# Patient Record
Sex: Male | Born: 1990 | Race: Black or African American | Hispanic: No | Marital: Single | Smoking: Current every day smoker
Health system: Southern US, Community
[De-identification: ages and names within clinical notes are randomized; demographics above are authoritative.]

## PROBLEM LIST (undated history)

## (undated) DIAGNOSIS — Z720 Tobacco use: Secondary | ICD-10-CM

## (undated) DIAGNOSIS — K219 Gastro-esophageal reflux disease without esophagitis: Secondary | ICD-10-CM

## (undated) HISTORY — PX: NO PAST SURGERIES: SHX2092

---
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
## 2011-01-15 LAB — URINALYSIS, ROUTINE W REFLEX MICROSCOPIC
Bilirubin Urine: NEGATIVE
Glucose, UA: NEGATIVE mg/dL
Ketones, ur: NEGATIVE mg/dL
Nitrite: NEGATIVE
Protein, ur: NEGATIVE mg/dL
Specific Gravity, Urine: 1.025 (ref 1.005–1.030)
Urobilinogen, UA: 0.2 mg/dL (ref 0.0–1.0)
pH: 6 (ref 5.0–8.0)

## 2011-01-15 LAB — URINE MICROSCOPIC-ADD ON

## 2011-01-15 LAB — URINE CULTURE
Colony Count: NO GROWTH
Culture  Setup Time: 201111051210
Culture: NO GROWTH

## 2011-01-15 LAB — GC/CHLAMYDIA PROBE AMP, URINE
Chlamydia, Swab/Urine, PCR: NEGATIVE
GC Probe Amp, Urine: POSITIVE — AB

## 2011-01-15 LAB — GRAM STAIN

## 2011-01-15 LAB — RPR: RPR Ser Ql: NONREACTIVE

## 2011-07-26 LAB — CULTURE, ROUTINE-ABSCESS: Gram Stain: NONE SEEN

## 2015-09-23 ENCOUNTER — Encounter (HOSPITAL_COMMUNITY): Payer: Self-pay | Admitting: Emergency Medicine

## 2015-09-23 ENCOUNTER — Emergency Department (HOSPITAL_COMMUNITY)
Admission: EM | Admit: 2015-09-23 | Discharge: 2015-09-23 | Disposition: A | Discharge status: Home / Self Care | Payer: Medicaid Other | Attending: Emergency Medicine | Admitting: Emergency Medicine

## 2015-09-23 DIAGNOSIS — R300 Dysuria: Secondary | ICD-10-CM | POA: Insufficient documentation

## 2015-09-23 DIAGNOSIS — F17200 Nicotine dependence, unspecified, uncomplicated: Secondary | ICD-10-CM | POA: Insufficient documentation

## 2015-09-23 DIAGNOSIS — Z711 Person with feared health complaint in whom no diagnosis is made: Secondary | ICD-10-CM

## 2015-09-23 DIAGNOSIS — R369 Urethral discharge, unspecified: Secondary | ICD-10-CM | POA: Insufficient documentation

## 2015-09-23 DIAGNOSIS — Z202 Contact with and (suspected) exposure to infections with a predominantly sexual mode of transmission: Secondary | ICD-10-CM | POA: Insufficient documentation

## 2015-09-23 LAB — URINALYSIS, ROUTINE W REFLEX MICROSCOPIC
Bilirubin Urine: NEGATIVE
GLUCOSE, UA: NEGATIVE mg/dL
KETONES UR: NEGATIVE mg/dL
LEUKOCYTES UA: NEGATIVE
Nitrite: NEGATIVE
PROTEIN: NEGATIVE mg/dL
Specific Gravity, Urine: 1.009 (ref 1.005–1.030)
pH: 6 (ref 5.0–8.0)

## 2015-09-23 LAB — URINE MICROSCOPIC-ADD ON: Bacteria, UA: NONE SEEN

## 2015-09-23 LAB — RPR: RPR Ser Ql: NONREACTIVE

## 2015-09-23 LAB — HIV ANTIBODY (ROUTINE TESTING W REFLEX): HIV Screen 4th Generation wRfx: NONREACTIVE

## 2015-09-23 MED ORDER — CEFTRIAXONE SODIUM 250 MG IJ SOLR
250.0000 mg | Freq: Once | INTRAMUSCULAR | Status: AC
  Administered 2015-09-23: 250 mg via INTRAMUSCULAR
  Filled 2015-09-23: qty 250

## 2015-09-23 MED ORDER — IBUPROFEN 200 MG PO TABS
600.0000 mg | ORAL_TABLET | Freq: Once | ORAL | Status: AC
  Administered 2015-09-23: 600 mg via ORAL
  Filled 2015-09-23: qty 3

## 2015-09-23 MED ORDER — AZITHROMYCIN 250 MG PO TABS
1000.0000 mg | ORAL_TABLET | Freq: Once | ORAL | Status: AC
  Administered 2015-09-23: 1000 mg via ORAL
  Filled 2015-09-23: qty 4

## 2015-09-23 MED ORDER — LIDOCAINE HCL (PF) 1 % IJ SOLN
INTRAMUSCULAR | Status: AC
  Administered 2015-09-23: 1 mL
  Filled 2015-09-23: qty 5

## 2015-09-23 NOTE — ED Notes (Signed)
Patient reports that he believes he has a STD. Reports lower back pain, clear penile discharge, and pain/burning with urination. Reports back pain as 6/10 on 0-10 pain scale.

## 2015-09-23 NOTE — Discharge Instructions (Signed)
You were seen today with concerns for an STD. You need to abstain from sexual activity for the next 10 days. You also need to use protection.  Sexually Transmitted Disease A sexually transmitted disease (STD) is a disease or infection that may be passed (transmitted) from person to person, usually during sexual activity. This may happen by way of saliva, semen, blood, vaginal mucus, or urine. Common STDs include:  Gonorrhea.  Chlamydia.  Syphilis.  HIV and AIDS.  Genital herpes.  Hepatitis B and C.  Trichomonas.  Human papillomavirus (HPV).  Pubic lice.  Scabies.  Mites.  Bacterial vaginosis. WHAT ARE CAUSES OF STDs? An STD may be caused by bacteria, a virus, or parasites. STDs are often transmitted during sexual activity if one person is infected. However, they may also be transmitted through nonsexual means. STDs may be transmitted after:   Sexual intercourse with an infected person.  Sharing sex toys with an infected person.  Sharing needles with an infected person or using unclean piercing or tattoo needles.  Having intimate contact with the genitals, mouth, or rectal areas of an infected person.  Exposure to infected fluids during birth. WHAT ARE THE SIGNS AND SYMPTOMS OF STDs? Different STDs have different symptoms. Some people may not have any symptoms. If symptoms are present, they may include:  Painful or bloody urination.  Pain in the pelvis, abdomen, vagina, anus, throat, or eyes.  A skin rash, itching, or irritation.  Growths, ulcerations, blisters, or sores in the genital and anal areas.  Abnormal vaginal discharge with or without bad odor.  Penile discharge in men.  Fever.  Pain or bleeding during sexual intercourse.  Swollen glands in the groin area.  Yellow skin and eyes (jaundice). This is seen with hepatitis.  Swollen testicles.  Infertility.  Sores and blisters in the mouth. HOW ARE STDs DIAGNOSED? To make a diagnosis, your  health care provider may:  Take a medical history.  Perform a physical exam.  Take a sample of any discharge to examine.  Swab the throat, cervix, opening to the penis, rectum, or vagina for testing.  Test a sample of your first morning urine.  Perform blood tests.  Perform a Pap test, if this applies.  Perform a colposcopy.  Perform a laparoscopy. HOW ARE STDs TREATED? Treatment depends on the STD. Some STDs may be treated but not cured.  Chlamydia, gonorrhea, trichomonas, and syphilis can be cured with antibiotic medicine.  Genital herpes, hepatitis, and HIV can be treated, but not cured, with prescribed medicines. The medicines lessen symptoms.  Genital warts from HPV can be treated with medicine or by freezing, burning (electrocautery), or surgery. Warts may come back.  HPV cannot be cured with medicine or surgery. However, abnormal areas may be removed from the cervix, vagina, or vulva.  If your diagnosis is confirmed, your recent sexual partners need treatment. This is true even if they are symptom-free or have a negative culture or evaluation. They should not have sex until their health care providers say it is okay.  Your health care provider may test you for infection again 3 months after treatment. HOW CAN I REDUCE MY RISK OF GETTING AN STD? Take these steps to reduce your risk of getting an STD:  Use latex condoms, dental dams, and water-soluble lubricants during sexual activity. Do not use petroleum jelly or oils.  Avoid having multiple sex partners.  Do not have sex with someone who has other sex partners  Do not have sex with anyone  you do not know or who is at high risk for an STD.  Avoid risky sex practices that can break your skin.  Do not have sex if you have open sores on your mouth or skin.  Avoid drinking too much alcohol or taking illegal drugs. Alcohol and drugs can affect your judgment and put you in a vulnerable position.  Avoid engaging in  oral and anal sex acts.  Get vaccinated for HPV and hepatitis. If you have not received these vaccines in the past, talk to your health care provider about whether one or both might be right for you.  If you are at risk of being infected with HIV, it is recommended that you take a prescription medicine daily to prevent HIV infection. This is called pre-exposure prophylaxis (PrEP). You are considered at risk if:  You are a man who has sex with other men (MSM).  You are a heterosexual man or woman and are sexually active with more than one partner.  You take drugs by injection.  You are sexually active with a partner who has HIV.  Talk with your health care provider about whether you are at high risk of being infected with HIV. If you choose to begin PrEP, you should first be tested for HIV. You should then be tested every 3 months for as long as you are taking PrEP. WHAT SHOULD I DO IF I THINK I HAVE AN STD?  See your health care provider.  Tell your sexual partner(s). They should be tested and treated for any STDs.  Do not have sex until your health care provider says it is okay. WHEN SHOULD I GET IMMEDIATE MEDICAL CARE? Contact your health care provider right away if:   You have severe abdominal pain.  You are a man and notice swelling or pain in your testicles.  You are a woman and notice swelling or pain in your vagina.   This information is not intended to replace advice given to you by your health care provider. Make sure you discuss any questions you have with your health care provider.   Document Released: 01/11/2003 Document Revised: 11/11/2014 Document Reviewed: 05/11/2013 Elsevier Interactive Patient Education [employer] Inc[employer]

## 2015-09-23 NOTE — ED Provider Notes (Signed)
CSN: [id number]     Arrival date & time 09/23/15  [id number] History   First MD Initiated Contact with Patient 09/23/15 0654     Chief Complaint  Patient presents with  . SEXUALLY TRANSMITTED DISEASE     (Consider location/radiation/quality/duration/timing/severity/associated sxs/prior Treatment) HPI  This a 24 year old male who presents with concerns for STDs. Patient reports recent history of dysuria, clear penile discharge, lower back pain. History of chlamydia with the same symptoms. Back pain is across his lower back and currently 6 out of 10. He has not taken any medications for. Denies any fevers. Does report multiple sexual partners. Does not always use condoms.  History reviewed. No pertinent past medical history. History reviewed. No pertinent past surgical history. No family history on file. Social History  Substance Use Topics  . Smoking status: Current Every Day Smoker  . Smokeless tobacco: None  . Alcohol Use: None    Review of Systems  Constitutional: Negative for fever.  Genitourinary: Positive for dysuria and discharge.  Musculoskeletal: Negative for back pain.  All other systems reviewed and are negative.     Allergies  Review of patient's allergies indicates no known allergies.  Home Medications   Prior to Admission medications   Not on File   BP 116/92 mmHg  Pulse 87  Temp(Src) 99.5 F (37.5 C) (Oral)  Resp 18  Ht 5\' 6"  (1.676 m)  SpO2 96% Physical Exam  Constitutional: He is oriented to person, place, and time. He appears well-developed and well-nourished. No distress.  HENT:  Head: Normocephalic and atraumatic.  Cardiovascular: Normal rate and regular rhythm.   Pulmonary/Chest: Effort normal. No respiratory distress.  Abdominal: Soft. There is no tenderness.  Musculoskeletal: He exhibits no edema.  No tenderness to palpation over the lower lumbar spine paraspinous muscle region  Neurological: He is alert and oriented to person, place, and  time.  Skin: Skin is warm and dry.  Psychiatric: He has a normal mood and affect.  Nursing note and vitals reviewed.   ED Course  Procedures (including critical care time) Labs Review Labs Reviewed  URINALYSIS, ROUTINE W REFLEX MICROSCOPIC (NOT AT [facility])  RPR  HIV ANTIBODY (ROUTINE TESTING)  GC/CHLAMYDIA PROBE AMP ([place]) NOT [facility]    Imaging Review No results found. I have personally reviewed and evaluated these images and lab results as part of my medical decision-making.   EKG Interpretation None      MDM   Final diagnoses:  Concern about STD in male without diagnosis    Patient presents with concerns for STDs. History of the same similar symptoms. Nontoxic on exam. Afebrile. No CVA or lumbar tenderness. Patient was given ibuprofen. He was empirically treated for gonorrhea and chlamydia with azithromycin and Rocephin. STD panel sent including GC and chlamydia. Urinalysis largely unremarkable.  Patient instructed to abstain from sexual activity for the next 10 days. He was educated on safe sex practices.  After history, exam, and medical workup I feel the patient has been appropriately medically screened and is safe for discharge home. Pertinent diagnoses were discussed with the patient. Patient was given return precautions.     Shon Batonourtney F Leanndra Pember, MD 09/23/15 2300

## 2015-09-27 LAB — GC/CHLAMYDIA PROBE AMP (~~LOC~~) NOT AT ARMC
Chlamydia: NEGATIVE
Neisseria Gonorrhea: NEGATIVE

## 2016-10-26 ENCOUNTER — Encounter (HOSPITAL_COMMUNITY): Payer: Self-pay | Admitting: Emergency Medicine

## 2016-10-26 ENCOUNTER — Emergency Department (HOSPITAL_COMMUNITY)
Admission: EM | Admit: 2016-10-26 | Discharge: 2016-10-26 | Disposition: A | Discharge status: Home / Self Care | Payer: Medicaid Other | Attending: Emergency Medicine | Admitting: Emergency Medicine

## 2016-10-26 DIAGNOSIS — R1012 Left upper quadrant pain: Secondary | ICD-10-CM

## 2016-10-26 DIAGNOSIS — F17200 Nicotine dependence, unspecified, uncomplicated: Secondary | ICD-10-CM | POA: Insufficient documentation

## 2016-10-26 DIAGNOSIS — E876 Hypokalemia: Secondary | ICD-10-CM | POA: Insufficient documentation

## 2016-10-26 LAB — COMPREHENSIVE METABOLIC PANEL
ALBUMIN: 4.2 g/dL (ref 3.5–5.0)
ALT: 8 U/L — AB (ref 17–63)
ANION GAP: 12 (ref 5–15)
AST: 19 U/L (ref 15–41)
Alkaline Phosphatase: 49 U/L (ref 38–126)
BUN: 12 mg/dL (ref 6–20)
CHLORIDE: 103 mmol/L (ref 101–111)
CO2: 23 mmol/L (ref 22–32)
Calcium: 9.4 mg/dL (ref 8.9–10.3)
Creatinine, Ser: 1 mg/dL (ref 0.61–1.24)
GFR calc non Af Amer: >60 mL/min (ref >60)
Glucose, Bld: 100 mg/dL — ABNORMAL HIGH (ref 65–99)
Potassium: 3.4 mmol/L — ABNORMAL LOW (ref 3.5–5.1)
SODIUM: 138 mmol/L (ref 135–145)
Total Bilirubin: 0.4 mg/dL (ref 0.3–1.2)
Total Protein: 7 g/dL (ref 6.5–8.1)

## 2016-10-26 LAB — CBC
HCT: 43.1 % (ref 39.0–52.0)
HEMOGLOBIN: 14.8 g/dL (ref 13.0–17.0)
MCH: 29.1 pg (ref 26.0–34.0)
MCHC: 34.3 g/dL (ref 30.0–36.0)
MCV: 84.7 fL (ref 78.0–100.0)
Platelets: 229 10*3/uL (ref 150–400)
RBC: 5.09 MIL/uL (ref 4.22–5.81)
RDW: 14.1 % (ref 11.5–15.5)
WBC: 7 10*3/uL (ref 4.0–10.5)

## 2016-10-26 LAB — URINALYSIS, ROUTINE W REFLEX MICROSCOPIC
BILIRUBIN URINE: NEGATIVE
Glucose, UA: NEGATIVE mg/dL
Hgb urine dipstick: NEGATIVE
KETONES UR: NEGATIVE mg/dL
Leukocytes, UA: NEGATIVE
Nitrite: NEGATIVE
PROTEIN: NEGATIVE mg/dL
SPECIFIC GRAVITY, URINE: 1.029 (ref 1.005–1.030)
pH: 5 (ref 5.0–8.0)

## 2016-10-26 LAB — I-STAT CG4 LACTIC ACID, ED: LACTIC ACID, VENOUS: 0.85 mmol/L (ref 0.5–1.9)

## 2016-10-26 LAB — LIPASE, BLOOD: LIPASE: 25 U/L (ref 11–51)

## 2016-10-26 MED ORDER — OMEPRAZOLE 20 MG PO CPDR: 20.0000 mg | DELAYED_RELEASE_CAPSULE | Freq: Every day | ORAL | 0 refills | Status: DC

## 2016-10-26 MED ORDER — PANTOPRAZOLE SODIUM 40 MG PO TBEC
40.0000 mg | DELAYED_RELEASE_TABLET | Freq: Once | ORAL | Status: AC
  Administered 2016-10-26: 40 mg via ORAL
  Filled 2016-10-26: qty 1

## 2016-10-26 MED ORDER — POTASSIUM CHLORIDE CRYS ER 20 MEQ PO TBCR
40.0000 meq | EXTENDED_RELEASE_TABLET | Freq: Once | ORAL | Status: AC
  Administered 2016-10-26: 40 meq via ORAL
  Filled 2016-10-26: qty 2

## 2016-10-26 NOTE — Discharge Instructions (Signed)
1. Medications: omeprazole, usual home medications 2. Treatment: rest, drink plenty of fluids,  3. Follow Up: Please followup with your primary doctor in 2 days for discussion of your diagnoses and further evaluation after today's visit; if you do not have a primary care doctor use the resource guide provided to find one; Please return to the ER for persistent vomiting, high fevers or worsening symptoms

## 2016-10-26 NOTE — ED Triage Notes (Signed)
Pt states "Ive had some stomach pain for two days." Pt pointing to LUQ. nontender to palpation. Denies n/v/d. Pt in NAD. Pain 5/10.

## 2016-10-26 NOTE — ED Provider Notes (Signed)
[facility]-EMERGENCY DEPT Provider Note   CSN: [id number] Arrival date & time: 10/26/16  1759     History   Chief Complaint Chief Complaint  Patient presents with  . Abdominal Pain    HPI Reginald Farmer is a 25 y.o. male with No major medical problems presents to the Emergency Department complaining of intermittent left upper quadrant abdominal pain onset 2 weeks ago with an intense episode 2 days ago. He states pain is cramping and rated at a 5/10 when it happens. No radiation of the pain. It lasts less than 30 minutes and resolves. No pain today.  Patient denies pain currently. Patient denies fever, chills, nausea, vomiting, diarrhea, diaphoresis, chest pain, shortness of breath, dysuria, hematuria, penile discharge. Patient reports he drinks alcohol socially. Pain is not changed with food or drink. He smokes marijuana regularly. No IV drug use. No history of abdominal surgeries. Pt reports he is here today to "just get checked out."    The history is provided by the patient and medical records. No language interpreter was used.    History reviewed. No pertinent past medical history.  There are no active problems to display for this patient.   History reviewed. No pertinent surgical history.     Home Medications    Prior to Admission medications   Medication Sig Start Date End Date Taking? Authorizing Provider  omeprazole (PRILOSEC) 20 MG capsule Take 1 capsule (20 mg total) by mouth daily. 10/26/16   Dahlia ClientHannah Fareeha Evon, PA-C    Family History No family history on file.  Social History Social History  Substance Use Topics  . Smoking status: Current Every Day Smoker  . Smokeless tobacco: Not on file  . Alcohol use No     Allergies   Patient has no known allergies.   Review of Systems Review of Systems  Gastrointestinal: Positive for abdominal pain ( LUQ, resolved).  All other systems reviewed and are negative.    Physical Exam Updated Vital Signs BP  135/90 (BP Location: Right Arm)   Pulse 100   Temp 99.2 F (37.3 C) (Oral)   Resp 25   Ht 5\' 6"  (1.676 m)   Wt 99.2 kg   SpO2 96%   BMI 35.28 kg/m   Physical Exam  Constitutional: He appears well-developed and well-nourished. No distress.  Awake, alert, nontoxic appearance  HENT:  Head: Normocephalic and atraumatic.  Mouth/Throat: Oropharynx is clear and moist. No oropharyngeal exudate.  Eyes: Conjunctivae are normal. No scleral icterus.  Neck: Normal range of motion. Neck supple.  Cardiovascular: Normal rate, regular rhythm and intact distal pulses.   Pulmonary/Chest: Effort normal and breath sounds normal. No respiratory distress. He has no wheezes.  Equal chest expansion  Abdominal: Soft. Bowel sounds are normal. He exhibits no mass. There is no tenderness. There is no rebound and no guarding.  Musculoskeletal: Normal range of motion. He exhibits no edema.  Neurological: He is alert.  Speech is clear and goal oriented Moves extremities without ataxia  Skin: Skin is warm and dry. He is not diaphoretic.  Psychiatric: He has a normal mood and affect.  Nursing note and vitals reviewed.    ED Treatments / Results  Labs (all labs ordered are listed, but only abnormal results are displayed) Labs Reviewed  COMPREHENSIVE METABOLIC PANEL - Abnormal; Notable for the following:       Result Value   Potassium 3.4 (*)    Glucose, Bld 100 (*)    ALT 8 (*)  All other components within normal limits  LIPASE, BLOOD  CBC  URINALYSIS, ROUTINE W REFLEX MICROSCOPIC  I-STAT CG4 LACTIC ACID, ED    Procedures Procedures (including critical care time)  Medications Ordered in ED Medications  pantoprazole (PROTONIX) EC tablet 40 mg (not administered)  potassium chloride SA (K-DUR,KLOR-CON) CR tablet 40 mEq (not administered)     Initial Impression / Assessment and Plan / ED Course  I have reviewed the triage vital signs and the nursing notes.  Pertinent labs & imaging results  that were available during my care of the patient were reviewed by me and considered in my medical decision making (see chart for details).  Clinical Course as of Oct 26 2040  Sat Oct 26, 2016  2036 Triage temperature 99.2. Recommend recheck rectally and patient refuses.  [HM]  2037 No UTI Leukocytes, UA: NEGATIVE [HM]  2037 Mild, repleated Potassium: (!) 3.4 [HM]  2037 normal Lactic Acid, Venous: 0.85 [HM]  2037 No leukocytosis WBC: 7.0 [HM]  2037 Normal Lipase: 25 [HM]  2038 Tachycardia at triage, no tachycardia on clinical exam.  No hx of DVT, immobilization, CP, SOB or hypoxia.   Pulse Rate: 100 [HM]    Clinical Course User Index [HM] Dahlia ClientHannah Dariush Mcnellis, PA-C    Patient is nontoxic, nonseptic appearing, in no apparent distress.  Patient without pain in the ER.  Labs and vitals reviewed.  Patient does not meet the SIRS or Sepsis criteria.  On exam patient does not have a surgical abdomin and there are no peritoneal signs.  No indication of appendicitis, bowel obstruction, bowel perforation, cholecystitis, diverticulitis.  Patient discharged home with short course of omeprazole and given strict instructions for follow-up with their primary care physician.  I have also discussed reasons to return immediately to the ER.  Patient expresses understanding and agrees with plan.    Final Clinical Impressions(s) / ED Diagnoses   Final diagnoses:  Left upper quadrant pain  Hypokalemia    New Prescriptions New Prescriptions   OMEPRAZOLE (PRILOSEC) 20 MG CAPSULE    Take 1 capsule (20 mg total) by mouth daily.     Dahlia ClientHannah Camiyah Friberg, PA-C 10/26/16 [id number]    Pricilla LovelessScott Goldston, MD 10/28/16 [phone]

## 2018-05-14 ENCOUNTER — Emergency Department (HOSPITAL_COMMUNITY)
Admission: EM | Admit: 2018-05-14 | Discharge: 2018-05-14 | Disposition: A | Discharge status: Home / Self Care | Payer: Self-pay | Attending: Emergency Medicine | Admitting: Emergency Medicine

## 2018-05-14 ENCOUNTER — Other Ambulatory Visit: Payer: Self-pay

## 2018-05-14 ENCOUNTER — Encounter (HOSPITAL_COMMUNITY): Payer: Self-pay | Admitting: Emergency Medicine

## 2018-05-14 DIAGNOSIS — Z79899 Other long term (current) drug therapy: Secondary | ICD-10-CM | POA: Insufficient documentation

## 2018-05-14 DIAGNOSIS — R51 Headache: Secondary | ICD-10-CM | POA: Insufficient documentation

## 2018-05-14 DIAGNOSIS — F17200 Nicotine dependence, unspecified, uncomplicated: Secondary | ICD-10-CM | POA: Insufficient documentation

## 2018-05-14 DIAGNOSIS — R519 Headache, unspecified: Secondary | ICD-10-CM

## 2018-05-14 DIAGNOSIS — R1084 Generalized abdominal pain: Secondary | ICD-10-CM | POA: Insufficient documentation

## 2018-05-14 LAB — URINALYSIS, ROUTINE W REFLEX MICROSCOPIC
Bilirubin Urine: NEGATIVE
GLUCOSE, UA: NEGATIVE mg/dL
Hgb urine dipstick: NEGATIVE
Ketones, ur: NEGATIVE mg/dL
LEUKOCYTES UA: NEGATIVE
NITRITE: NEGATIVE
PH: 5 (ref 5.0–8.0)
Protein, ur: NEGATIVE mg/dL
SPECIFIC GRAVITY, URINE: 1.03 (ref 1.005–1.030)

## 2018-05-14 LAB — CBC
HEMATOCRIT: 45.4 % (ref 39.0–52.0)
HEMOGLOBIN: 14.4 g/dL (ref 13.0–17.0)
MCH: 27.2 pg (ref 26.0–34.0)
MCHC: 31.7 g/dL (ref 30.0–36.0)
MCV: 85.7 fL (ref 78.0–100.0)
Platelets: 253 10*3/uL (ref 150–400)
RBC: 5.3 MIL/uL (ref 4.22–5.81)
RDW: 14.6 % (ref 11.5–15.5)
WBC: 10.3 10*3/uL (ref 4.0–10.5)

## 2018-05-14 LAB — LIPASE, BLOOD: LIPASE: 35 U/L (ref 11–51)

## 2018-05-14 LAB — COMPREHENSIVE METABOLIC PANEL
ALT: 9 U/L (ref 0–44)
ANION GAP: 13 (ref 5–15)
AST: 16 U/L (ref 15–41)
Albumin: 4.3 g/dL (ref 3.5–5.0)
Alkaline Phosphatase: 51 U/L (ref 38–126)
BUN: 12 mg/dL (ref 6–20)
CHLORIDE: 103 mmol/L (ref 98–111)
CO2: 25 mmol/L (ref 22–32)
Calcium: 9.6 mg/dL (ref 8.9–10.3)
Creatinine, Ser: 1.29 mg/dL — ABNORMAL HIGH (ref 0.61–1.24)
GFR calc Af Amer: >60 mL/min (ref >60)
Glucose, Bld: 94 mg/dL (ref 70–99)
Potassium: 3.7 mmol/L (ref 3.5–5.1)
Sodium: 141 mmol/L (ref 135–145)
TOTAL PROTEIN: 7.3 g/dL (ref 6.5–8.1)
Total Bilirubin: 0.9 mg/dL (ref 0.3–1.2)

## 2018-05-14 MED ORDER — IBUPROFEN 400 MG PO TABS
600.0000 mg | ORAL_TABLET | Freq: Once | ORAL | Status: AC
  Administered 2018-05-14: 600 mg via ORAL
  Filled 2018-05-14: qty 1

## 2018-05-14 NOTE — ED Triage Notes (Signed)
Pt reports headache and abdominal pain since two am this morning.  Pt reports he took tylenol however the pain continues.

## 2018-05-14 NOTE — ED Provider Notes (Signed)
[facility] Provider Note   CSN: [id number] Arrival date & time: 05/14/18  0536     History   Chief Complaint Chief Complaint  Patient presents with  . Headache  . Abdominal Pain    HPI Reginald Farmer is a 27 y.o. male.  27 year old male without significant prior medical history presents for evaluation of reported headache and abdominal pain.  Patient reports that he had a mild headache that started around 10 PM last night.  He developed mild upper abdominal discomfort sometime around 2 AM.  He reports taking a dose of Tylenol sometime around 3 AM for his symptoms.  He now reports that his symptoms are improved.  He denies associated fever, nausea, vomiting, chest pain, shortness of breath, or other acute complaint.  Patient declines IV access for IVF/IV medications or even IM medications.  He desires a work note for his illness.  The history is provided by the patient and medical records.  Illness  This is a new problem. The current episode started 6 to 12 hours ago. The problem occurs rarely. The problem has been resolved. Associated symptoms include abdominal pain. Pertinent negatives include no chest pain, no headaches and no shortness of breath. Nothing aggravates the symptoms. Nothing relieves the symptoms. He has tried acetaminophen for the symptoms. The treatment provided significant relief.    History reviewed. No pertinent past medical history.  There are no active problems to display for this patient.   History reviewed. No pertinent surgical history.      Home Medications    Prior to Admission medications   Medication Sig Start Date End Date Taking? Authorizing Provider  omeprazole (PRILOSEC) 20 MG capsule Take 1 capsule (20 mg total) by mouth daily. 10/26/16   Muthersbaugh, Dahlia ClientHannah, PA-C    Family History No family history on file.  Social History Social History   Tobacco Use  . Smoking status: Current Every Day  Smoker  Substance Use Topics  . Alcohol use: No  . Drug use: Yes    Types: Marijuana     Allergies   Patient has no known allergies.   Review of Systems Review of Systems  Respiratory: Negative for shortness of breath.   Cardiovascular: Negative for chest pain.  Gastrointestinal: Positive for abdominal pain.  Neurological: Negative for headaches.  All other systems reviewed and are negative.    Physical Exam Updated Vital Signs BP 125/85   Pulse 97   Temp 98.7 F (37.1 C) (Oral)   Resp 17   SpO2 98%   Physical Exam  Constitutional: He is oriented to person, place, and time. He appears well-developed and well-nourished. No distress.  HENT:  Head: Normocephalic and atraumatic.  Mouth/Throat: Oropharynx is clear and moist.  Eyes: Pupils are equal, round, and reactive to light. Conjunctivae and EOM are normal.  Neck: Normal range of motion. Neck supple.  Cardiovascular: Normal rate, regular rhythm and normal heart sounds.  Pulmonary/Chest: Effort normal and breath sounds normal. No respiratory distress.  Abdominal: Soft. He exhibits no distension. There is no tenderness.  Abdomen soft, nontender, and nondistended  Exam is benign   Musculoskeletal: Normal range of motion. He exhibits no edema or deformity.  Neurological: He is alert and oriented to person, place, and time.  Skin: Skin is warm and dry.  Psychiatric: He has a normal mood and affect.  Nursing note and vitals reviewed.    ED Treatments / Results  Labs (all labs ordered are listed, but  only abnormal results are displayed) Labs Reviewed  COMPREHENSIVE METABOLIC PANEL - Abnormal; Notable for the following components:      Result Value   Creatinine, Ser 1.29 (*)    All other components within normal limits  LIPASE, BLOOD  CBC  URINALYSIS, ROUTINE W REFLEX MICROSCOPIC    EKG None  Radiology No results found.  Procedures Procedures (including critical care time)  Medications Ordered in  ED Medications  ibuprofen (ADVIL,MOTRIN) tablet 600 mg (has no administration in time range)     Initial Impression / Assessment and Plan / ED Course  I have reviewed the triage vital signs and the nursing notes.  Pertinent labs & imaging results that were available during my care of the patient were reviewed by me and considered in my medical decision making (see chart for details).     MDM  Screen complete  Patient is presenting for evaluation of headache and reported abdominal pain.  Patient's symptoms are not suggestive of significant acute pathology.  Patient's exam does not reveal evidence of significant acute pathology.  Screening labs are without remarkable abnormality.    Patient feels improved at time of my evaluation.  He now desires discharge home.  He does request a work note for later today.  Close follow-up instructions given and understood.  Strict return precautions given and understood.    Final Clinical Impressions(s) / ED Diagnoses   Final diagnoses:  Generalized abdominal pain  Nonintractable headache, unspecified chronicity pattern, unspecified headache type    ED Discharge Orders    None       Wynetta Fines, MD 05/14/18 [phone]

## 2018-05-14 NOTE — ED Notes (Signed)
Patient verbalizes understanding of discharge instructions. Opportunity for questioning and answers were provided. Armband removed by staff, pt discharged from ED.  

## 2018-05-14 NOTE — Discharge Instructions (Addendum)
Please return for any problem. Follow up with a regular physician as instructed.  °

## 2019-04-14 ENCOUNTER — Encounter (HOSPITAL_COMMUNITY): Payer: Self-pay

## 2019-04-14 ENCOUNTER — Inpatient Hospital Stay (HOSPITAL_COMMUNITY)
Admission: EM | Admit: 2019-04-14 | Discharge: 2019-04-17 | DRG: 572 | Disposition: A | Discharge status: Home / Self Care | Payer: Self-pay | Attending: Internal Medicine | Admitting: Internal Medicine

## 2019-04-14 ENCOUNTER — Other Ambulatory Visit: Payer: Self-pay

## 2019-04-14 DIAGNOSIS — Z1159 Encounter for screening for other viral diseases: Secondary | ICD-10-CM

## 2019-04-14 DIAGNOSIS — L0211 Cutaneous abscess of neck: Principal | ICD-10-CM | POA: Diagnosis present

## 2019-04-14 DIAGNOSIS — L03221 Cellulitis of neck: Secondary | ICD-10-CM

## 2019-04-14 DIAGNOSIS — K219 Gastro-esophageal reflux disease without esophagitis: Secondary | ICD-10-CM | POA: Diagnosis present

## 2019-04-14 DIAGNOSIS — Z720 Tobacco use: Secondary | ICD-10-CM | POA: Diagnosis present

## 2019-04-14 DIAGNOSIS — Z833 Family history of diabetes mellitus: Secondary | ICD-10-CM

## 2019-04-14 DIAGNOSIS — F17210 Nicotine dependence, cigarettes, uncomplicated: Secondary | ICD-10-CM | POA: Diagnosis present

## 2019-04-14 HISTORY — DX: Gastro-esophageal reflux disease without esophagitis: K21.9

## 2019-04-14 HISTORY — DX: Tobacco use: Z72.0

## 2019-04-14 MED ORDER — OXYCODONE-ACETAMINOPHEN 5-325 MG PO TABS
1.0000 | ORAL_TABLET | ORAL | Status: DC | PRN
  Administered 2019-04-14: 1 via ORAL
  Filled 2019-04-14: qty 1

## 2019-04-14 NOTE — ED Triage Notes (Signed)
Pt bib ems for abscess to the right side of his neck, started as a small pimple, pt picked at it and it is now inflamed.

## 2019-04-15 ENCOUNTER — Encounter (HOSPITAL_COMMUNITY)
Admission: EM | Disposition: A | Discharge status: Home / Self Care | Payer: Self-pay | Source: Home / Self Care | Attending: Internal Medicine

## 2019-04-15 ENCOUNTER — Inpatient Hospital Stay (HOSPITAL_COMMUNITY): Payer: Self-pay | Admitting: Anesthesiology

## 2019-04-15 ENCOUNTER — Emergency Department (HOSPITAL_COMMUNITY): Payer: Self-pay

## 2019-04-15 ENCOUNTER — Encounter (HOSPITAL_COMMUNITY): Payer: Self-pay | Admitting: Internal Medicine

## 2019-04-15 DIAGNOSIS — K219 Gastro-esophageal reflux disease without esophagitis: Secondary | ICD-10-CM

## 2019-04-15 DIAGNOSIS — Z720 Tobacco use: Secondary | ICD-10-CM

## 2019-04-15 DIAGNOSIS — L03221 Cellulitis of neck: Secondary | ICD-10-CM

## 2019-04-15 DIAGNOSIS — L0291 Cutaneous abscess, unspecified: Secondary | ICD-10-CM

## 2019-04-15 DIAGNOSIS — L0211 Cutaneous abscess of neck: Principal | ICD-10-CM

## 2019-04-15 HISTORY — DX: Cutaneous abscess, unspecified: L02.91

## 2019-04-15 HISTORY — PX: INCISION AND DRAINAGE ABSCESS: SHX5864

## 2019-04-15 LAB — CBC WITH DIFFERENTIAL/PLATELET
Abs Immature Granulocytes: 0.06 10*3/uL (ref 0.00–0.07)
Basophils Absolute: 0.1 10*3/uL (ref 0.0–0.1)
Basophils Relative: 1 %
Eosinophils Absolute: 0.3 10*3/uL (ref 0.0–0.5)
Eosinophils Relative: 3 %
HCT: 42.8 % (ref 39.0–52.0)
Hemoglobin: 13.8 g/dL (ref 13.0–17.0)
Immature Granulocytes: 1 %
Lymphocytes Relative: 18 %
Lymphs Abs: 2.3 10*3/uL (ref 0.7–4.0)
MCH: 28.5 pg (ref 26.0–34.0)
MCHC: 32.2 g/dL (ref 30.0–36.0)
MCV: 88.2 fL (ref 80.0–100.0)
Monocytes Absolute: 1.3 10*3/uL — ABNORMAL HIGH (ref 0.1–1.0)
Monocytes Relative: 10 %
Neutro Abs: 8.8 10*3/uL — ABNORMAL HIGH (ref 1.7–7.7)
Neutrophils Relative %: 67 %
Platelets: 237 10*3/uL (ref 150–400)
RBC: 4.85 MIL/uL (ref 4.22–5.81)
RDW: 14.7 % (ref 11.5–15.5)
WBC: 12.9 10*3/uL — ABNORMAL HIGH (ref 4.0–10.5)
nRBC: 0 % (ref 0.0–0.2)

## 2019-04-15 LAB — BASIC METABOLIC PANEL
Anion gap: 9 (ref 5–15)
Anion gap: 11 (ref 5–15)
BUN: 12 mg/dL (ref 6–20)
BUN: 10 mg/dL (ref 6–20)
CO2: 23 mmol/L (ref 22–32)
CO2: 22 mmol/L (ref 22–32)
Calcium: 8.9 mg/dL (ref 8.9–10.3)
Calcium: 8.8 mg/dL — ABNORMAL LOW (ref 8.9–10.3)
Chloride: 104 mmol/L (ref 98–111)
Chloride: 103 mmol/L (ref 98–111)
Creatinine, Ser: 1.02 mg/dL (ref 0.61–1.24)
Creatinine, Ser: 0.99 mg/dL (ref 0.61–1.24)
GFR calc Af Amer: >60 mL/min (ref >60)
GFR calc Af Amer: >60 mL/min (ref >60)
GFR calc non Af Amer: >60 mL/min (ref >60)
GFR calc non Af Amer: >60 mL/min (ref >60)
Glucose, Bld: 97 mg/dL (ref 70–99)
Glucose, Bld: 100 mg/dL — ABNORMAL HIGH (ref 70–99)
Potassium: 4.1 mmol/L (ref 3.5–5.1)
Potassium: 3.3 mmol/L — ABNORMAL LOW (ref 3.5–5.1)
Sodium: 136 mmol/L (ref 135–145)
Sodium: 136 mmol/L (ref 135–145)

## 2019-04-15 LAB — CBC
HCT: 42.3 % (ref 39.0–52.0)
Hemoglobin: 13.7 g/dL (ref 13.0–17.0)
MCH: 28.6 pg (ref 26.0–34.0)
MCHC: 32.4 g/dL (ref 30.0–36.0)
MCV: 88.3 fL (ref 80.0–100.0)
Platelets: 241 10*3/uL (ref 150–400)
RBC: 4.79 MIL/uL (ref 4.22–5.81)
RDW: 14.6 % (ref 11.5–15.5)
WBC: 14.4 10*3/uL — ABNORMAL HIGH (ref 4.0–10.5)
nRBC: 0 % (ref 0.0–0.2)

## 2019-04-15 LAB — C-REACTIVE PROTEIN: CRP: 4 mg/dL — ABNORMAL HIGH (ref <1.0)

## 2019-04-15 LAB — LACTIC ACID, PLASMA
Lactic Acid, Venous: 0.7 mmol/L (ref 0.5–1.9)
Lactic Acid, Venous: 0.6 mmol/L (ref 0.5–1.9)

## 2019-04-15 LAB — PROTIME-INR
INR: 1.2 (ref 0.8–1.2)
Prothrombin Time: 15.4 seconds — ABNORMAL HIGH (ref 11.4–15.2)

## 2019-04-15 LAB — HIV ANTIBODY (ROUTINE TESTING W REFLEX): HIV Screen 4th Generation wRfx: NONREACTIVE

## 2019-04-15 LAB — SEDIMENTATION RATE: Sed Rate: 7 mm/hr (ref 0–16)

## 2019-04-15 LAB — APTT: aPTT: 30 seconds (ref 24–36)

## 2019-04-15 LAB — SARS CORONAVIRUS 2: SARS Coronavirus 2: NOT DETECTED

## 2019-04-15 LAB — MRSA PCR SCREENING: MRSA by PCR: NEGATIVE

## 2019-04-15 LAB — PROCALCITONIN: Procalcitonin: <0.1 ng/mL

## 2019-04-15 SURGERY — INCISION AND DRAINAGE, ABSCESS
Anesthesia: General | Site: Neck

## 2019-04-15 MED ORDER — 0.9 % SODIUM CHLORIDE (POUR BTL) OPTIME
TOPICAL | Status: DC | PRN
  Administered 2019-04-15: 1000 mL

## 2019-04-15 MED ORDER — CLINDAMYCIN PHOSPHATE 900 MG/50ML IV SOLN
900.0000 mg | Freq: Once | INTRAVENOUS | Status: AC
  Administered 2019-04-15: 04:00:00 900 mg via INTRAVENOUS
  Filled 2019-04-15: qty 50

## 2019-04-15 MED ORDER — VANCOMYCIN HCL IN DEXTROSE 1-5 GM/200ML-% IV SOLN
1000.0000 mg | Freq: Two times a day (BID) | INTRAVENOUS | Status: DC
  Administered 2019-04-15 – 2019-04-17 (×4): 1000 mg via INTRAVENOUS
  Filled 2019-04-15 (×5): qty 200

## 2019-04-15 MED ORDER — OXYCODONE HCL 5 MG/5ML PO SOLN: 5.0000 mg | Freq: Once | ORAL | Status: DC | PRN

## 2019-04-15 MED ORDER — ACETAMINOPHEN 325 MG PO TABS: 650.0000 mg | ORAL_TABLET | Freq: Four times a day (QID) | ORAL | Status: DC | PRN

## 2019-04-15 MED ORDER — MIDAZOLAM HCL 5 MG/5ML IJ SOLN
INTRAMUSCULAR | Status: DC | PRN
  Administered 2019-04-15: 2 mg via INTRAVENOUS

## 2019-04-15 MED ORDER — FENTANYL CITRATE (PF) 250 MCG/5ML IJ SOLN
INTRAMUSCULAR | Status: AC
  Filled 2019-04-15: qty 5

## 2019-04-15 MED ORDER — SODIUM CHLORIDE 0.9 % IV SOLN
INTRAVENOUS | Status: DC
  Administered 2019-04-15 – 2019-04-17 (×5): via INTRAVENOUS

## 2019-04-15 MED ORDER — PROPOFOL 10 MG/ML IV BOLUS
INTRAVENOUS | Status: DC | PRN
  Administered 2019-04-15: 200 mg via INTRAVENOUS

## 2019-04-15 MED ORDER — IOHEXOL 300 MG/ML  SOLN
75.0000 mL | Freq: Once | INTRAMUSCULAR | Status: AC | PRN
  Administered 2019-04-15: 75 mL via INTRAVENOUS

## 2019-04-15 MED ORDER — MORPHINE SULFATE (PF) 2 MG/ML IV SOLN
2.0000 mg | INTRAVENOUS | Status: DC | PRN
  Administered 2019-04-15: 05:00:00 2 mg via INTRAVENOUS
  Filled 2019-04-15 (×2): qty 1

## 2019-04-15 MED ORDER — SUGAMMADEX SODIUM 200 MG/2ML IV SOLN
INTRAVENOUS | Status: DC | PRN
  Administered 2019-04-15: 200 mg via INTRAVENOUS

## 2019-04-15 MED ORDER — VANCOMYCIN HCL 10 G IV SOLR
2000.0000 mg | Freq: Once | INTRAVENOUS | Status: AC
  Administered 2019-04-15: 2000 mg via INTRAVENOUS
  Filled 2019-04-15 (×2): qty 2000

## 2019-04-15 MED ORDER — MIDAZOLAM HCL 2 MG/2ML IJ SOLN
INTRAMUSCULAR | Status: AC
  Filled 2019-04-15: qty 2

## 2019-04-15 MED ORDER — ONDANSETRON HCL 4 MG/2ML IJ SOLN
INTRAMUSCULAR | Status: DC | PRN
  Administered 2019-04-15: 4 mg via INTRAVENOUS

## 2019-04-15 MED ORDER — OXYCODONE-ACETAMINOPHEN 5-325 MG PO TABS
1.0000 | ORAL_TABLET | ORAL | Status: DC | PRN
  Administered 2019-04-16 – 2019-04-17 (×4): 1 via ORAL
  Filled 2019-04-15 (×4): qty 1

## 2019-04-15 MED ORDER — BUPIVACAINE-EPINEPHRINE (PF) 0.25% -1:200000 IJ SOLN
INTRAMUSCULAR | Status: AC
  Filled 2019-04-15: qty 30

## 2019-04-15 MED ORDER — MORPHINE SULFATE (PF) 4 MG/ML IV SOLN
4.0000 mg | Freq: Once | INTRAVENOUS | Status: AC
Start: 2019-04-15 — End: 2019-04-15
  Administered 2019-04-15: 01:00:00 4 mg via INTRAVENOUS
  Filled 2019-04-15: qty 1

## 2019-04-15 MED ORDER — MEPERIDINE HCL 25 MG/ML IJ SOLN: 6.2500 mg | INTRAMUSCULAR | Status: DC | PRN

## 2019-04-15 MED ORDER — DEXAMETHASONE SODIUM PHOSPHATE 10 MG/ML IJ SOLN
INTRAMUSCULAR | Status: DC | PRN
  Administered 2019-04-15: 10 mg via INTRAVENOUS

## 2019-04-15 MED ORDER — ROCURONIUM BROMIDE 50 MG/5ML IV SOSY
PREFILLED_SYRINGE | INTRAVENOUS | Status: DC | PRN
  Administered 2019-04-15: 40 mg via INTRAVENOUS

## 2019-04-15 MED ORDER — PROPOFOL 10 MG/ML IV BOLUS
INTRAVENOUS | Status: AC
  Filled 2019-04-15: qty 20

## 2019-04-15 MED ORDER — LIDOCAINE-EPINEPHRINE (PF) 2 %-1:200000 IJ SOLN
10.0000 mL | Freq: Once | INTRAMUSCULAR | Status: AC
  Administered 2019-04-15: 10 mL
  Filled 2019-04-15: qty 20

## 2019-04-15 MED ORDER — PANTOPRAZOLE SODIUM 40 MG PO TBEC
40.0000 mg | DELAYED_RELEASE_TABLET | Freq: Every day | ORAL | Status: DC
  Administered 2019-04-16 – 2019-04-17 (×2): 40 mg via ORAL
  Filled 2019-04-15 (×3): qty 1

## 2019-04-15 MED ORDER — SODIUM CHLORIDE 0.9 % IV BOLUS
1000.0000 mL | Freq: Once | INTRAVENOUS | Status: AC
  Administered 2019-04-15: 05:00:00 1000 mL via INTRAVENOUS

## 2019-04-15 MED ORDER — PROMETHAZINE HCL 25 MG/ML IJ SOLN: 6.2500 mg | INTRAMUSCULAR | Status: DC | PRN

## 2019-04-15 MED ORDER — ONDANSETRON HCL 4 MG PO TABS: 4.0000 mg | ORAL_TABLET | Freq: Four times a day (QID) | ORAL | Status: DC | PRN

## 2019-04-15 MED ORDER — ONDANSETRON HCL 4 MG/2ML IJ SOLN: 4.0000 mg | Freq: Four times a day (QID) | INTRAMUSCULAR | Status: DC | PRN

## 2019-04-15 MED ORDER — LIDOCAINE 2% (20 MG/ML) 5 ML SYRINGE
INTRAMUSCULAR | Status: DC | PRN
  Administered 2019-04-15: 100 mg via INTRAVENOUS

## 2019-04-15 MED ORDER — HYDROMORPHONE HCL 1 MG/ML IJ SOLN: 0.2500 mg | INTRAMUSCULAR | Status: DC | PRN

## 2019-04-15 MED ORDER — PHENYLEPHRINE 40 MCG/ML (10ML) SYRINGE FOR IV PUSH (FOR BLOOD PRESSURE SUPPORT)
PREFILLED_SYRINGE | INTRAVENOUS | Status: AC
  Filled 2019-04-15: qty 10

## 2019-04-15 MED ORDER — LACTATED RINGERS IV SOLN
INTRAVENOUS | Status: DC
  Administered 2019-04-15: 12:00:00 via INTRAVENOUS

## 2019-04-15 MED ORDER — PIPERACILLIN-TAZOBACTAM 3.375 G IVPB 30 MIN
3.3750 g | Freq: Once | INTRAVENOUS | Status: AC
  Administered 2019-04-15: 3.375 g via INTRAVENOUS

## 2019-04-15 MED ORDER — OXYCODONE HCL 5 MG PO TABS: 5.0000 mg | ORAL_TABLET | Freq: Once | ORAL | Status: DC | PRN

## 2019-04-15 MED ORDER — NICOTINE 21 MG/24HR TD PT24
21.0000 mg | MEDICATED_PATCH | Freq: Every day | TRANSDERMAL | Status: DC
  Filled 2019-04-15 (×3): qty 1

## 2019-04-15 MED ORDER — ONDANSETRON HCL 4 MG/2ML IJ SOLN
INTRAMUSCULAR | Status: AC
  Filled 2019-04-15: qty 2

## 2019-04-15 MED ORDER — PIPERACILLIN-TAZOBACTAM 3.375 G IVPB
3.3750 g | Freq: Three times a day (TID) | INTRAVENOUS | Status: DC
  Administered 2019-04-15 – 2019-04-17 (×6): 3.375 g via INTRAVENOUS
  Filled 2019-04-15 (×8): qty 50

## 2019-04-15 MED ORDER — FENTANYL CITRATE (PF) 100 MCG/2ML IJ SOLN
INTRAMUSCULAR | Status: DC | PRN
  Administered 2019-04-15: 100 ug via INTRAVENOUS

## 2019-04-15 SURGICAL SUPPLY — 32 items
BLADE CLIPPER SURG (BLADE) IMPLANT
BNDG GAUZE ELAST 4 BULKY (GAUZE/BANDAGES/DRESSINGS) IMPLANT
CANISTER SUCT 3000ML PPV (MISCELLANEOUS) ×3 IMPLANT
CHLORAPREP W/TINT 26ML (MISCELLANEOUS) IMPLANT
COVER SURGICAL LIGHT HANDLE (MISCELLANEOUS) ×3 IMPLANT
COVER WAND RF STERILE (DRAPES) ×3 IMPLANT
DRAPE LAPAROSCOPIC ABDOMINAL (DRAPES) IMPLANT
DRAPE LAPAROTOMY 100X72 PEDS (DRAPES) IMPLANT
DRSG PAD ABDOMINAL 8X10 ST (GAUZE/BANDAGES/DRESSINGS) IMPLANT
ELECT CAUTERY BLADE 6.4 (BLADE) IMPLANT
ELECT REM PT RETURN 9FT ADLT (ELECTROSURGICAL) ×3
ELECTRODE REM PT RTRN 9FT ADLT (ELECTROSURGICAL) ×1 IMPLANT
GAUZE SPONGE 4X4 12PLY STRL (GAUZE/BANDAGES/DRESSINGS) IMPLANT
GLOVE BIO SURGEON STRL SZ7 (GLOVE) ×3 IMPLANT
GLOVE BIOGEL PI IND STRL 7.5 (GLOVE) ×1 IMPLANT
GLOVE BIOGEL PI INDICATOR 7.5 (GLOVE) ×2
GOWN STRL REUS W/ TWL LRG LVL3 (GOWN DISPOSABLE) ×2 IMPLANT
GOWN STRL REUS W/TWL LRG LVL3 (GOWN DISPOSABLE) ×6
KIT BASIN OR (CUSTOM PROCEDURE TRAY) ×3 IMPLANT
KIT TURNOVER KIT B (KITS) ×3 IMPLANT
NS IRRIG 1000ML POUR BTL (IV SOLUTION) ×3 IMPLANT
PACK GENERAL/GYN (CUSTOM PROCEDURE TRAY) ×3 IMPLANT
PAD ARMBOARD 7.5X6 YLW CONV (MISCELLANEOUS) ×3 IMPLANT
PENCIL SMOKE EVACUATOR (MISCELLANEOUS) ×3 IMPLANT
SOL PREP PROV IODINE SCRUB 4OZ (MISCELLANEOUS) ×2 IMPLANT
SPONGE GAUZE 2X2 8PLY STER LF (GAUZE/BANDAGES/DRESSINGS) ×1
SPONGE GAUZE 2X2 8PLY STRL LF (GAUZE/BANDAGES/DRESSINGS) ×1 IMPLANT
SWAB COLLECTION DEVICE MRSA (MISCELLANEOUS) IMPLANT
SWAB CULTURE ESWAB REG 1ML (MISCELLANEOUS) IMPLANT
TAPE PAPER 3X10 WHT MICROPORE (GAUZE/BANDAGES/DRESSINGS) ×2 IMPLANT
TOWEL OR 17X24 6PK STRL BLUE (TOWEL DISPOSABLE) ×3 IMPLANT
TOWEL OR 17X26 10 PK STRL BLUE (TOWEL DISPOSABLE) ×1 IMPLANT

## 2019-04-15 NOTE — Anesthesia Procedure Notes (Signed)
Procedure Name: Intubation Date/Time: 04/15/2019 3:36 PM Performed by: Inda Coke, CRNA Pre-anesthesia Checklist: Patient identified, Emergency Drugs available, Suction available and Patient being monitored Patient Re-evaluated:Patient Re-evaluated prior to induction Oxygen Delivery Method: Circle System Utilized Preoxygenation: Pre-oxygenation with 100% oxygen Induction Type: IV induction Ventilation: Mask ventilation without difficulty Laryngoscope Size: Mac and 4 Grade View: Grade I Tube type: Oral Tube size: 7.5 mm Number of attempts: 1 Airway Equipment and Method: Stylet and Oral airway Placement Confirmation: ETT inserted through vocal cords under direct vision,  positive ETCO2 and breath sounds checked- equal and bilateral Secured at: 22 cm Tube secured with: Tape Dental Injury: Teeth and Oropharynx as per pre-operative assessment

## 2019-04-15 NOTE — ED Notes (Signed)
ED TO INPATIENT HANDOFF REPORT  ED Nurse Name and Phone #: Lew DawesKelly Mariaceleste Herrera RN #[phone]  S Name/Age/Gender Reginald Farmer 28 y.o. male Room/Bed: RESUSC/RESUSC  Code Status   Code Status: Not on file  Home/SNF/Other Home Patient oriented to: self ,place , situation  Is this baseline? Yes   Triage Complete: Triage complete  Chief Complaint Cyst on neck   Triage Note Pt bib ems for abscess to the right side of his neck, started as a small pimple, pt picked at it and it is now inflamed.    Allergies No Known Allergies  Level of Care/Admitting Diagnosis ED Disposition    ED Disposition Condition Comment   Admit  Hospital Area: [facility] [[id number]]  Level of Care: Med-Surg [16]  I expect the patient will be discharged within 24 hours: No (not a candidate for 5C-Observation unit)  Covid Evaluation: Screening Protocol (No Symptoms)  Diagnosis: Cellulitis and abscess of neck [960[682.1.ICD-9-CM]  Admitting Physician: Lorretta HarpNIU, XILIN [[id number]]  Attending Physician: Lorretta HarpNIU, XILIN [[id number]]  PT Class (Do Not Modify): Observation [104]  PT Acc Code (Do Not Modify): Observation [10022]       B Medical/Surgery History Past Medical History:  Diagnosis Date  . GERD (gastroesophageal reflux disease)   . Tobacco abuse    History reviewed. No pertinent surgical history.   A IV Location/Drains/Wounds Patient Lines/Drains/Airways Status   Active Line/Drains/Airways    Name:   Placement date:   Placement time:   Site:   Days:   Peripheral IV 04/15/19 Left Hand   04/15/19    0408    Hand   less than 1          Intake/Output Last 24 hours No intake or output data in the 24 hours ending 04/15/19 0500  Labs/Imaging Results for orders placed or performed during the hospital encounter of 04/14/19 (from the past 48 hour(s))  CBC with Differential/Platelet     Status: Abnormal   Collection Time: 04/15/19 12:58 AM  Result Value Ref Range   WBC 12.9 (H) 4.0 - 10.5 K/uL   RBC 4.85  4.22 - 5.81 MIL/uL   Hemoglobin 13.8 13.0 - 17.0 g/dL   HCT 45.442.8 09.839.0 - 11.952.0 %   MCV 88.2 80.0 - 100.0 fL   MCH 28.5 26.0 - 34.0 pg   MCHC 32.2 30.0 - 36.0 g/dL   RDW 14.714.7 82.911.5 - 56.215.5 %   Platelets 237 150 - 400 K/uL   nRBC 0.0 0.0 - 0.2 %   Neutrophils Relative % 67 %   Neutro Abs 8.8 (H) 1.7 - 7.7 K/uL   Lymphocytes Relative 18 %   Lymphs Abs 2.3 0.7 - 4.0 K/uL   Monocytes Relative 10 %   Monocytes Absolute 1.3 (H) 0.1 - 1.0 K/uL   Eosinophils Relative 3 %   Eosinophils Absolute 0.3 0.0 - 0.5 K/uL   Basophils Relative 1 %   Basophils Absolute 0.1 0.0 - 0.1 K/uL   Immature Granulocytes 1 %   Abs Immature Granulocytes 0.06 0.00 - 0.07 K/uL    Comment: Performed at [facility], [address] [place], [place] [address]  Basic metabolic panel     Status: None   Collection Time: 04/15/19 12:58 AM  Result Value Ref Range   Sodium 136 135 - 145 mmol/L   Potassium 4.1 3.5 - 5.1 mmol/L   Chloride 104 98 - 111 mmol/L   CO2 23 22 - 32 mmol/L   Glucose, Bld 97  70 - 99 mg/dL   BUN 12 6 - 20 mg/dL   Creatinine, Ser 1.611.02 0.61 - 1.24 mg/dL   Calcium 8.9 8.9 - 09.610.3 mg/dL   GFR calc non Af Amer >60 >60 mL/min   GFR calc Af Amer >60 >60 mL/min   Anion gap 9 5 - 15    Comment: Performed at [facility], [address] [place], [place] [address]  Wound or Superficial Culture     Status: None (Preliminary result)   Collection Time: 04/15/19  4:23 AM   Specimen: Abscess; Wound  Result Value Ref Range   Specimen Description ABSCESS NECK    Special Requests      NONE Performed at [facility], [address] [place], [place] [address]    Gram Stain PENDING    Culture PENDING    Report Status PENDING    Ct Soft Tissue Neck W Contrast  Result Date: 04/15/2019 CLINICAL DATA:  Initial evaluation for right-sided neck swelling. EXAM: CT NECK WITH CONTRAST TECHNIQUE: Multidetector CT imaging of the neck was performed using the standard protocol following the bolus  administration of intravenous contrast. CONTRAST:  75mL OMNIPAQUE IOHEXOL 300 MG/ML  SOLN COMPARISON:  None. FINDINGS: Pharynx and larynx: Examination limited by motion artifact and patient positioning. Oral cavity within normal limits. No acute inflammatory changes about the dentition. Nasopharynx and oropharynx grossly within normal limits, although evaluation limited by motion. No discernible retropharyngeal collection. Epiglottis grossly normal. Remainder of the hypopharynx and supraglottic larynx grossly within normal limits. True cords symmetric and normal. Subglottic airway clear. Salivary glands: Inflammatory changes surround the right parotid gland related to the adjacent inflammatory process within the right neck. Mild extension into the right submandibular space, partially surrounding the right submandibular gland as well. Salivary glands otherwise unremarkable. Thyroid: Negative. Lymph nodes: 12 mm right level II node, likely reactive. No other pathologically enlarged lymph nodes seen within the neck. Vascular: Normal intravascular enhancement seen throughout the neck. Limited intracranial: Unremarkable. Visualized orbits: Visualized globes and orbital soft tissues within normal limits. Mastoids and visualized paranasal sinuses: Visualized paranasal sinuses are clear. Mastoid air cells and middle ear cavities are well pneumatized and free of fluid. Skeleton: No acute osseous finding. No discrete lytic or blastic osseous lesions. Upper chest: Visualized upper chest demonstrates no acute finding. Partially visualized lungs are clear. Other: Extensive soft tissue swelling with inflammatory stranding seen throughout the right lateral neck, compatible with acute cellulitis. Overlying skin thickening. Inflammatory changes partially surround the right sternocleidomastoid muscle. Extension into the right parotid and submandibular spaces as well as the submental region, then inferiorly along the anterior aspect  of the anterior neck towards the upper chest. Partial extension into the right parapharyngeal space as well. There is an ill-defined area of hypodensity involving the subcutaneous fat at the epicenter of this inflammatory process within the right neck measuring approximately 2.5 x 1.4 x 3.8 cm (AP by transverse by craniocaudad, concerning for phlegmon and/or early abscess. This is at the site of maximal soft tissue/skin swelling. IMPRESSION: 1. Extensive soft tissue swelling with inflammatory stranding throughout the right lateral neck, compatible with acute cellulitis. Superimposed 2.5 x 1.4 x 3.8 cm ill-defined hypodensity within the subcutaneous fat at the epicenter of this inflammatory process, concerning for phlegmon and/or early abscess. 2. Mildly enlarged right level II lymph node, likely reactive. Electronically Signed   By: Rise MuBenjamin  McClintock M.D.   On: 04/15/2019 03:29    Pending Labs Unresulted Labs (From admission,  onward)    Start     Ordered   04/15/19 0444  Culture, blood (x 2)  BLOOD CULTURE X 2,   STAT    Comments: INITIATE ANTIBIOTICS WITHIN 1 HOUR AFTER BLOOD CULTURES DRAWN.  If unable to obtain blood cultures, call MD immediately regarding antibiotic instructions.    04/15/19 0443   04/15/19 0355  Novel Coronavirus,NAA,(SEND-OUT TO REF LAB - TAT 24-48 hrs); Hosp Order  (Asymptomatic Patients Labs)  Once,   STAT    Question:  Rule Out  Answer:  Yes   04/15/19 0354   Signed and Held  Lactic acid, plasma  STAT Now then every 3 hours,   STAT     Signed and Held   Signed and Held  Procalcitonin  ONCE - STAT,   R     Signed and Held   Signed and Held  Protime-INR  Once,   R     Signed and Held   Signed and Held  APTT  Once,   R     Signed and Held   Signed and Held  HIV antibody (Routine Testing)  Once,   R     Signed and Held   Signed and Occupational hygienist morning,   R     Signed and Held   Signed and Held  CBC  Tomorrow morning,   R     Signed and Held           Vitals/Pain Today's Vitals   04/14/19 2055 04/14/19 2341 04/15/19 0431  BP: 139/89 118/65 111/61  Pulse: 96 89 87  Resp: 18 18 16   Temp: 98.6 F (37 C)  99.4 F (37.4 C)  TempSrc: Oral  Oral  SpO2: 96% 98% 97%  PainSc: 9   0-No pain    Isolation Precautions No active isolations  Medications Medications  oxyCODONE-acetaminophen (PERCOCET/ROXICET) 5-325 MG per tablet 1 tablet (has no administration in time range)  morphine 2 MG/ML injection 2 mg (has no administration in time range)  sodium chloride 0.9 % bolus 1,000 mL (has no administration in time range)  0.9 %  sodium chloride infusion (has no administration in time range)  morphine 4 MG/ML injection 4 mg (4 mg Intravenous Given 04/15/19 0059)  iohexol (OMNIPAQUE) 300 MG/ML solution 75 mL (75 mLs Intravenous Contrast Given 04/15/19 0255)  clindamycin (CLEOCIN) IVPB 900 mg (0 mg Intravenous Stopped 04/15/19 0441)  lidocaine-EPINEPHrine (XYLOCAINE W/EPI) 2 %-1:200000 (PF) injection 10 mL (10 mLs Infiltration Given 04/15/19 0422)    Mobility walks     Focused Assessments Infection wound care   R Recommendations: See Admitting Provider Note  Report given to:   Additional Notes:

## 2019-04-15 NOTE — Progress Notes (Addendum)
PROGRESS NOTE    Reginald Farmer  GQQ:[id number] DOB: October 25, 1991 DOA: 04/14/2019 PCP: Patient, No Pcp Per   Brief Narrative:  HPI on 04/15/2019 by Dr. Ivor Costa Reginald Farmer is a 28 y.o. male with medical history significant of GERD, tobacco abuse, who presents with neck pain and neck abscess.  Pt states he has been having right sided neck pain and neck abscess for several day. The pain is constant, severe, sharp, nonradiating.He states that he initally noticed a small pimple on the right side of his neck few days ago. It has been gradually increasing in size, and more painful and swollen. Patient does not have fever or chills.  Denies any chest pain, shortness of breath.  No nausea vomiting, diarrhea, abdominal pain, symptoms of UTI or unilateral weakness. He denies any throat swelling sensation or difficulty breathing.  Assessment & Plan    Admitted earlier today by Dr. Ivor Costa. See H&P for details.   Cellulitis and abscess of Neck, right -S/p partial drainage by EDP -Continues to have significant edema and pain with increasing leukocytosis -CT soft tissue neck: Symptoms soft tissue swelling with inflammatory stranding throughout the right lateral neck, compatible with acute cellulitis.  2.5 x 1.4 x 3.8 cm ill-defined hypodensity within the subcutaneous fat at the epicenter of this inflammatory process concerning for phlegmon and/or early abscess. -Blood cultures pending -Wound culture pending -ESR and CRP pending -Continue vancomycin and zosyn -General surgery consulted and appreciated -Continue pain control  Tobacco abuse -Continue nicotine patch  GERD -Continue PPI   COVID test pending. Patient currently asymptomatic.  PPE worn: Gown, gloves, face shield, surgical mask  DVT Prophylaxis  SCDs  Code Status: Full  Family Communication: None at bedside  Disposition Plan: Currently in observation. However given severity of edema and pain, patient will likely require  additional days of hospitalization for IV antibiotics and intervention.  Will transition to inpatient.  Suspect home when stable.  Consultants General surgery  Procedures  None  Antibiotics   Anti-infectives (From admission, onward)   Start     Dose/Rate Route Frequency Ordered Stop   04/15/19 2000  vancomycin (VANCOCIN) IVPB 1000 mg/200 mL premix     1,000 mg 200 mL/hr over 60 Minutes Intravenous Every 12 hours 04/15/19 0753     04/15/19 1400  piperacillin-tazobactam (ZOSYN) IVPB 3.375 g     3.375 g 12.5 mL/hr over 240 Minutes Intravenous Every 8 hours 04/15/19 0502     04/15/19 0600  vancomycin (VANCOCIN) 2,000 mg in sodium chloride 0.9 % 500 mL IVPB     2,000 mg 250 mL/hr over 120 Minutes Intravenous  Once 04/15/19 0503     04/15/19 0530  piperacillin-tazobactam (ZOSYN) IVPB 3.375 g     3.375 g 100 mL/hr over 30 Minutes Intravenous  Once 04/15/19 0502 04/15/19 0711   04/15/19 0345  clindamycin (CLEOCIN) IVPB 900 mg     900 mg 100 mL/hr over 30 Minutes Intravenous  Once 04/15/19 0340 04/15/19 0441      Subjective:   Reginald Farmer seen and examined today.  Patient continues to have significant pain and swelling right side of his neck.  States he is extremely sleepy this morning.  Denies chest pain, shortness of breath, abdominal pain, nausea or vomiting, diarrhea constipation, dizziness or headache.  Objective:   Vitals:   04/14/19 2341 04/15/19 0431 04/15/19 0614 04/15/19 0700  BP: 118/65 111/61 140/83   Pulse: 89 87 91   Resp: 18 16 18  Temp:  99.4 F (37.4 C) 98.7 F (37.1 C)   TempSrc:  Oral Oral   SpO2: 98% 97% 98%   Weight:   87.8 kg   Height:    _0  (1.676 m)   No intake or output data in the 24 hours ending 04/15/19 0844 Filed Weights   04/15/19 0614  Weight: 87.8 kg    Exam  General: Well developed, well nourished, NAD, appears stated age  67: NCAT, mucous membranes moist.   Neck: Right sided edema and TTP  Cardiovascular: S1 S2  auscultated, RRR, no murmur  Respiratory: Clear to auscultation bilaterally   Abdomen: Soft, nontender, nondistended, + bowel sounds  Extremities: warm dry without cyanosis clubbing or edema  Neuro: AAOx3, nonfocal  Psych: Appropriate mood and affect   Data Reviewed: I have personally reviewed following labs and imaging studies  CBC: Recent Labs  Lab 04/15/19 0058 04/15/19 0520  WBC 12.9* 14.4*  NEUTROABS 8.8*  --   HGB 13.8 13.7  HCT 42.8 42.3  MCV 88.2 88.3  PLT 237 751   Basic Metabolic Panel: Recent Labs  Lab 04/15/19 0058 04/15/19 0520  NA 136 136  K 4.1 3.3*  CL 104 103  CO2 23 22  GLUCOSE 97 100*  BUN 12 10  CREATININE 1.02 0.99  CALCIUM 8.9 8.8*   GFR: Estimated Creatinine Clearance: 117.4 mL/min (by C-G formula based on SCr of 0.99 mg/dL). Liver Function Tests: No results for input(s): AST, ALT, ALKPHOS, BILITOT, PROT, ALBUMIN in the last 168 hours. No results for input(s): LIPASE, AMYLASE in the last 168 hours. No results for input(s): AMMONIA in the last 168 hours. Coagulation Profile: No results for input(s): INR, PROTIME in the last 168 hours. Cardiac Enzymes: No results for input(s): CKTOTAL, CKMB, CKMBINDEX, TROPONINI in the last 168 hours. BNP (last 3 results) No results for input(s): PROBNP in the last 8760 hours. HbA1C: No results for input(s): HGBA1C in the last 72 hours. CBG: No results for input(s): GLUCAP in the last 168 hours. Lipid Profile: No results for input(s): CHOL, HDL, LDLCALC, TRIG, CHOLHDL, LDLDIRECT in the last 72 hours. Thyroid Function Tests: No results for input(s): TSH, T4TOTAL, FREET4, T3FREE, THYROIDAB in the last 72 hours. Anemia Panel: No results for input(s): VITAMINB12, FOLATE, FERRITIN, TIBC, IRON, RETICCTPCT in the last 72 hours. Urine analysis:    Component Value Date/Time   COLORURINE YELLOW 05/14/2018 [place] 05/14/2018 0545   LABSPEC 1.030 05/14/2018 0545   PHURINE 5.0 05/14/2018  0545   GLUCOSEU NEGATIVE 05/14/2018 0545   HGBUR NEGATIVE 05/14/2018 0545   BILIRUBINUR NEGATIVE 05/14/2018 0545   KETONESUR NEGATIVE 05/14/2018 0545   PROTEINUR NEGATIVE 05/14/2018 0545   UROBILINOGEN 0.2 09/08/2010 0912   NITRITE NEGATIVE 05/14/2018 0545   LEUKOCYTESUR NEGATIVE 05/14/2018 0545   Sepsis Labs: _1 (procalcitonin:4,lacticidven:4)  ) Recent Results (from the past 240 hour(s))  Wound or Superficial Culture     Status: None (Preliminary result)   Collection Time: 04/15/19  4:23 AM   Specimen: Abscess; Wound  Result Value Ref Range Status   Specimen Description ABSCESS NECK  Final   Special Requests NONE  Final   Gram Stain   Final    MODERATE WBC PRESENT, PREDOMINANTLY PMN ABUNDANT GRAM POSITIVE COCCI IN PAIRS Performed at [facility], [address], [place], [place] [address]    Culture PENDING  Incomplete   Report Status PENDING  Incomplete      Radiology Studies: Ct Soft Tissue Neck  W Contrast  Result Date: 04/15/2019 CLINICAL DATA:  Initial evaluation for right-sided neck swelling. EXAM: CT NECK WITH CONTRAST TECHNIQUE: Multidetector CT imaging of the neck was performed using the standard protocol following the bolus administration of intravenous contrast. CONTRAST:  30m OMNIPAQUE IOHEXOL 300 MG/ML  SOLN COMPARISON:  None. FINDINGS: Pharynx and larynx: Examination limited by motion artifact and patient positioning. Oral cavity within normal limits. No acute inflammatory changes about the dentition. Nasopharynx and oropharynx grossly within normal limits, although evaluation limited by motion. No discernible retropharyngeal collection. Epiglottis grossly normal. Remainder of the hypopharynx and supraglottic larynx grossly within normal limits. True cords symmetric and normal. Subglottic airway clear. Salivary glands: Inflammatory changes surround the right parotid gland related to the adjacent inflammatory process within the right neck. Mild extension  into the right submandibular space, partially surrounding the right submandibular gland as well. Salivary glands otherwise unremarkable. Thyroid: Negative. Lymph nodes: 12 mm right level II node, likely reactive. No other pathologically enlarged lymph nodes seen within the neck. Vascular: Normal intravascular enhancement seen throughout the neck. Limited intracranial: Unremarkable. Visualized orbits: Visualized globes and orbital soft tissues within normal limits. Mastoids and visualized paranasal sinuses: Visualized paranasal sinuses are clear. Mastoid air cells and middle ear cavities are well pneumatized and free of fluid. Skeleton: No acute osseous finding. No discrete lytic or blastic osseous lesions. Upper chest: Visualized upper chest demonstrates no acute finding. Partially visualized lungs are clear. Other: Extensive soft tissue swelling with inflammatory stranding seen throughout the right lateral neck, compatible with acute cellulitis. Overlying skin thickening. Inflammatory changes partially surround the right sternocleidomastoid muscle. Extension into the right parotid and submandibular spaces as well as the submental region, then inferiorly along the anterior aspect of the anterior neck towards the upper chest. Partial extension into the right parapharyngeal space as well. There is an ill-defined area of hypodensity involving the subcutaneous fat at the epicenter of this inflammatory process within the right neck measuring approximately 2.5 x 1.4 x 3.8 cm (AP by transverse by craniocaudad, concerning for phlegmon and/or early abscess. This is at the site of maximal soft tissue/skin swelling. IMPRESSION: 1. Extensive soft tissue swelling with inflammatory stranding throughout the right lateral neck, compatible with acute cellulitis. Superimposed 2.5 x 1.4 x 3.8 cm ill-defined hypodensity within the subcutaneous fat at the epicenter of this inflammatory process, concerning for phlegmon and/or early  abscess. 2. Mildly enlarged right level II lymph node, likely reactive. Electronically Signed   By: BJeannine BogaM.D.   On: 04/15/2019 03:29     Scheduled Meds: . nicotine  21 mg Transdermal Daily  . pantoprazole  40 mg Oral Daily   Continuous Infusions: . sodium chloride 125 mL/hr at 04/15/19 0646  . piperacillin-tazobactam (ZOSYN)  IV    . vancomycin 2,000 mg (04/15/19 0649)  . vancomycin       LOS: 0 days   Time Spent in minutes   30 minutes  Jace Dowe D.O. on 04/15/2019 at 8:44 AM  Between 7am to 7pm - Please see pager noted on [email or web address]  After 7pm go to [email or web address]  And look for the night coverage person covering for me after hours  [facility] Office  [phone]

## 2019-04-15 NOTE — Op Note (Signed)
Preop diagnosis: Right neck abscess  Postop diagnosis: Same Procedure performed: Sharp incision and debridement of right neck abscess (skin and subcutaneous tissues 2 x 2 x 1.5 cm) Surgeon:Deeksha Cotrell K Eisley Barber Anesthesia: General Indications: This is a healthy 28 year old male who presents with several days of progressively worsening pain and swelling in the right neck.  This began as a small pimple.  I&D was attempted in the ED but the patient has residual pain and swelling.  Description of procedure:  The patient was brought to the OR and placed in a supine position on the operating table.  After an adequate level of anesthesia was performed, he was positioned with his head tilted to the left.  He has a small opening that is draining purulent fluid.  This is surrounded by about 10 cm of swelling and thickening.  I excised a small circle of skin and subcutaneous tissue around the incision.  We entered the abscess cavity and evacuated all of the purulent fluid.  I debrided a small amount of necrotic subcutaneous tissue.  The entire cavity measured 2 x 2 x 1.5 cm.  Hemostasis was obtained with cautery.  We irrigated the wound thoroughly with saline.  The remainder of the swelling was examined and no other abscesses were noted.  The rest of this area seems to be cellulitis.  The wound was packed with saline-moistened 2x2 gauze and covered with a dry dressing.  The patient was extubated and brought to the recovery room in stable condition.  All counts were correct.  Imogene Burn. Georgette Dover, MD, [facility] Surgery  General/ Trauma Surgery Beeper [phone]  04/15/2019 4:10 PM

## 2019-04-15 NOTE — H&P (Signed)
History and Physical    Reginald Farmer BWL:[id number] DOB: [birth year] DOA: 04/14/2019  Referring MD/NP/PA:   PCP: Patient, No Pcp Per   Patient coming from:  The patient is coming from home.  At baseline, pt is independent for most of ADL.        Chief Complaint: neck pain and neck abscess  HPI: Reginald Farmer is a 28 y.o. male with medical history significant of GERD, tobacco abuse, who presents with neck pain and neck abscess.  Pt states he has been having right sided neck pain and neck abscess for several day. The pain is constant, severe, sharp, nonradiating. He states that he initally noticed a small pimple on the right side of his neck few days ago. It has been gradually increasing in size, and more painful and swollen. Patient does not have fever or chills.  Denies any chest pain, shortness of breath.  No nausea vomiting, diarrhea, abdominal pain, symptoms of UTI or unilateral weakness. He denies any throat swelling sensation or difficulty breathing.  ED Course: pt was found to have WBC 12.9, electrolytes renal function okay, temperature 99.4, no tachycardia, oxygen saturation 92% on room air.  ED physician did partial drainage to the abscess. Patient is placed on telemetry bed for observation.    # CT-Neck soft tissue:  1. Extensive soft tissue swelling with inflammatory stranding throughout the right lateral neck, compatible with acute cellulitis. Superimposed 2.5 x 1.4 x 3.8 cm ill-defined hypodensity within the subcutaneous fat at the epicenter of this inflammatory process, concerning for phlegmon and/or early abscess. 2. Mildly enlarged right level II lymph node, likely reactive  Review of Systems:   General: no fevers, chills, no body weight gain, has fatigue HEENT: no blurry vision, hearing changes or sore throat. Has neck pain, swelling and abscess. Respiratory: no dyspnea, coughing, wheezing CV: no chest pain, no palpitations GI: no nausea, vomiting, abdominal pain,  diarrhea, constipation GU: no dysuria, burning on urination, increased urinary frequency, hematuria  Ext: no leg edema Neuro: no unilateral weakness, numbness, or tingling, no vision change or hearing loss Skin: no rash, no skin tear. MSK: No muscle spasm, no deformity, no limitation of range of movement in spin Heme: No easy bruising.  Travel history: No recent long distant travel.  Allergy: No Known Allergies  Past Medical History:  Diagnosis Date   GERD (gastroesophageal reflux disease)    Tobacco abuse     History reviewed. No pertinent surgical history.  Social History:  reports that he has been smoking. He has never used smokeless tobacco. He reports current drug use. Drug: Marijuana. He reports that he does not drink alcohol.  Family History:  Family History  Problem Relation Age of Onset   Diabetes Mellitus II Maternal Grandmother      Prior to Admission medications   Medication Sig Start Date End Date Taking? Authorizing Provider  omeprazole (PRILOSEC) 20 MG capsule Take 1 capsule (20 mg total) by mouth daily. 10/26/16   Muthersbaugh, Jarrett Soho, PA-C    Physical Exam: Vitals:   04/14/19 2055 04/14/19 2341 04/15/19 0431 04/15/19 0614  BP: 139/89 118/65 111/61 140/83  Pulse: 96 89 87 91  Resp: '18 18 16 18  ' Temp: 98.6 F (37 C)  99.4 F (37.4 C) 98.7 F (37.1 C)  TempSrc: Oral  Oral Oral  SpO2: 96% 98% 97% 98%   General: Not in acute distress HEENT:       Eyes: PERRL, EOMI, no scleral icterus.  ENT: No discharge from the ears and nose, no pharynx injection, no tonsillar enlargement.        Neck: has a large area of induration in right side of neck, with surrounding erythema, tenderness and warmth. S/p of drainage of abscess. Heme: No neck lymph node enlargement. Cardiac: S1/S2, RRR, No murmurs, No gallops or rubs. Respiratory: No rales, wheezing, rhonchi or rubs. GI: Soft, nondistended, nontender, no rebound pain, no organomegaly, BS present. GU: No  hematuria Ext: No pitting leg edema bilaterally. 2+DP/PT pulse bilaterally. Musculoskeletal: No joint deformities, No joint redness or warmth, no limitation of ROM in spin. Skin: No rashes.  Neuro: Alert, oriented X3, cranial nerves II-XII grossly intact, moves all extremities normally.  Psych: Patient is not psychotic, no suicidal or hemocidal ideation.  Labs on Admission: I have personally reviewed following labs and imaging studies  CBC: Recent Labs  Lab 04/15/19 0058 04/15/19 0520  WBC 12.9* 14.4*  NEUTROABS 8.8*  --   HGB 13.8 13.7  HCT 42.8 42.3  MCV 88.2 88.3  PLT 237 784   Basic Metabolic Panel: Recent Labs  Lab 04/15/19 0058 04/15/19 0520  NA 136 136  K 4.1 3.3*  CL 104 103  CO2 23 22  GLUCOSE 97 100*  BUN 12 10  CREATININE 1.02 0.99  CALCIUM 8.9 8.8*   GFR: CrCl cannot be calculated (Unknown ideal weight.). Liver Function Tests: No results for input(s): AST, ALT, ALKPHOS, BILITOT, PROT, ALBUMIN in the last 168 hours. No results for input(s): LIPASE, AMYLASE in the last 168 hours. No results for input(s): AMMONIA in the last 168 hours. Coagulation Profile: No results for input(s): INR, PROTIME in the last 168 hours. Cardiac Enzymes: No results for input(s): CKTOTAL, CKMB, CKMBINDEX, TROPONINI in the last 168 hours. BNP (last 3 results) No results for input(s): PROBNP in the last 8760 hours. HbA1C: No results for input(s): HGBA1C in the last 72 hours. CBG: No results for input(s): GLUCAP in the last 168 hours. Lipid Profile: No results for input(s): CHOL, HDL, LDLCALC, TRIG, CHOLHDL, LDLDIRECT in the last 72 hours. Thyroid Function Tests: No results for input(s): TSH, T4TOTAL, FREET4, T3FREE, THYROIDAB in the last 72 hours. Anemia Panel: No results for input(s): VITAMINB12, FOLATE, FERRITIN, TIBC, IRON, RETICCTPCT in the last 72 hours. Urine analysis:    Component Value Date/Time   COLORURINE YELLOW 05/14/2018 [place] 05/14/2018  0545   LABSPEC 1.030 05/14/2018 0545   PHURINE 5.0 05/14/2018 0545   GLUCOSEU NEGATIVE 05/14/2018 0545   HGBUR NEGATIVE 05/14/2018 0545   BILIRUBINUR NEGATIVE 05/14/2018 0545   KETONESUR NEGATIVE 05/14/2018 0545   PROTEINUR NEGATIVE 05/14/2018 0545   UROBILINOGEN 0.2 09/08/2010 0912   NITRITE NEGATIVE 05/14/2018 0545   LEUKOCYTESUR NEGATIVE 05/14/2018 0545   Sepsis Labs: '@LABRCNTIP' (procalcitonin:4,lacticidven:4) ) Recent Results (from the past 240 hour(s))  Wound or Superficial Culture     Status: None (Preliminary result)   Collection Time: 04/15/19  4:23 AM   Specimen: Abscess; Wound  Result Value Ref Range Status   Specimen Description ABSCESS NECK  Final   Special Requests NONE  Final   Gram Stain   Final    MODERATE WBC PRESENT, PREDOMINANTLY PMN ABUNDANT GRAM POSITIVE COCCI IN PAIRS Performed at [facility], [address], [place], [place] [address]    Culture PENDING  Incomplete   Report Status PENDING  Incomplete     Radiological Exams on Admission: Ct Soft Tissue Neck W Contrast  Result Date: 04/15/2019 CLINICAL DATA:  Initial evaluation for right-sided neck swelling. EXAM: CT NECK WITH CONTRAST TECHNIQUE: Multidetector CT imaging of the neck was performed using the standard protocol following the bolus administration of intravenous contrast. CONTRAST:  36m OMNIPAQUE IOHEXOL 300 MG/ML  SOLN COMPARISON:  None. FINDINGS: Pharynx and larynx: Examination limited by motion artifact and patient positioning. Oral cavity within normal limits. No acute inflammatory changes about the dentition. Nasopharynx and oropharynx grossly within normal limits, although evaluation limited by motion. No discernible retropharyngeal collection. Epiglottis grossly normal. Remainder of the hypopharynx and supraglottic larynx grossly within normal limits. True cords symmetric and normal. Subglottic airway clear. Salivary glands: Inflammatory changes surround the right parotid gland related  to the adjacent inflammatory process within the right neck. Mild extension into the right submandibular space, partially surrounding the right submandibular gland as well. Salivary glands otherwise unremarkable. Thyroid: Negative. Lymph nodes: 12 mm right level II node, likely reactive. No other pathologically enlarged lymph nodes seen within the neck. Vascular: Normal intravascular enhancement seen throughout the neck. Limited intracranial: Unremarkable. Visualized orbits: Visualized globes and orbital soft tissues within normal limits. Mastoids and visualized paranasal sinuses: Visualized paranasal sinuses are clear. Mastoid air cells and middle ear cavities are well pneumatized and free of fluid. Skeleton: No acute osseous finding. No discrete lytic or blastic osseous lesions. Upper chest: Visualized upper chest demonstrates no acute finding. Partially visualized lungs are clear. Other: Extensive soft tissue swelling with inflammatory stranding seen throughout the right lateral neck, compatible with acute cellulitis. Overlying skin thickening. Inflammatory changes partially surround the right sternocleidomastoid muscle. Extension into the right parotid and submandibular spaces as well as the submental region, then inferiorly along the anterior aspect of the anterior neck towards the upper chest. Partial extension into the right parapharyngeal space as well. There is an ill-defined area of hypodensity involving the subcutaneous fat at the epicenter of this inflammatory process within the right neck measuring approximately 2.5 x 1.4 x 3.8 cm (AP by transverse by craniocaudad, concerning for phlegmon and/or early abscess. This is at the site of maximal soft tissue/skin swelling. IMPRESSION: 1. Extensive soft tissue swelling with inflammatory stranding throughout the right lateral neck, compatible with acute cellulitis. Superimposed 2.5 x 1.4 x 3.8 cm ill-defined hypodensity within the subcutaneous fat at the  epicenter of this inflammatory process, concerning for phlegmon and/or early abscess. 2. Mildly enlarged right level II lymph node, likely reactive. Electronically Signed   By: BJeannine BogaM.D.   On: 04/15/2019 03:29     EKG:  Not done in ED, will get one.   Assessment/Plan Principal Problem:   Cellulitis and abscess of neck Active Problems:   Tobacco abuse   GERD (gastroesophageal reflux disease)   Cellulitis and abscess of neck: ED physician did partial drainage.  Still has significant swelling.  Patient does not meet criteria for sepsis.  He has leukocytosis, but no fever or tachycardia.  He is at risk of developing sepsis.  - will place on tele bed for obs - Empiric antimicrobial treatment with vancomycin and zosyn (pt given 1 dose of clindamycin in ED) - PRN Zofran for nausea, morphine and Percocet for pain - Blood cultures x 2  - ESR and CRP - wound care consult - will get Procalcitonin and trend lactic acid levels - IVF: 1.0 L of NS bolus in ED, followed by 125 cc/h - may need to consult surgeon in AM  Tobacco abuse: -nicotine patch  GERD (gastroesophageal reflux disease): -protonix    DVT ppx: SCD Code  Status: Full code Family Communication: None at bed side.   Disposition Plan:  Anticipate discharge back to previous home environment Consults called:  none Admission status:   medical floor/obs  Date of Service 04/15/2019    [facility]   If 7PM-7AM, please contact night-coverage [email or web address] Password [facility] 04/15/2019, 6:58 AM

## 2019-04-15 NOTE — Progress Notes (Signed)
New Admission Note: ? Arrival Method: stretcher Mental Orientation: A/O x 4 Telemetry: N/A Assessment: Completed Skin: Refer to flowsheet IV: Left hand Pain: none Tubes: Safety Measures: Safety Fall Prevention Plan discussed with patient. Admission:  [place] Orientation: Patient has been orientated to the room, unit and the staff. Family: Orders have been reviewed and are being implemented. Will continue to monitor the patient. Call light has been placed within reach and bed alarm has been activated.  ? [employer], [place]

## 2019-04-15 NOTE — ED Notes (Signed)
Turkey sandwich given 

## 2019-04-15 NOTE — Progress Notes (Signed)
Report received from ED RN.

## 2019-04-15 NOTE — Anesthesia Preprocedure Evaluation (Signed)
Anesthesia Evaluation  Patient identified by MRN, date of birth, ID band Patient awake    Reviewed: Allergy & Precautions, NPO status , Patient's Chart, lab work & pertinent test results  Airway Mallampati: II  TM Distance: >3 FB Neck ROM: Full    Dental  (+) Dental Advisory Given   Pulmonary Current Smoker,    Pulmonary exam normal breath sounds clear to auscultation       Cardiovascular negative cardio ROS Normal cardiovascular exam Rhythm:Regular Rate:Normal     Neuro/Psych negative neurological ROS  negative psych ROS   GI/Hepatic Neg liver ROS, GERD  ,  Endo/Other  negative endocrine ROS  Renal/GU negative Renal ROS     Musculoskeletal negative musculoskeletal ROS (+)   Abdominal   Peds  Hematology negative hematology ROS (+)   Anesthesia Other Findings   Reproductive/Obstetrics                             Anesthesia Physical Anesthesia Plan  ASA: II  Anesthesia Plan: General   Post-op Pain Management:    Induction: Intravenous  PONV Risk Score and Plan: Ondansetron, Dexamethasone, Midazolam and Treatment may vary due to age or medical condition  Airway Management Planned: Oral ETT  Additional Equipment: None  Intra-op Plan:   Post-operative Plan: Extubation in OR  Informed Consent: I have reviewed the patients History and Physical, chart, labs and discussed the procedure including the risks, benefits and alternatives for the proposed anesthesia with the patient or authorized representative who has indicated his/her understanding and acceptance.     Dental advisory given  Plan Discussed with: CRNA  Anesthesia Plan Comments:         Anesthesia Quick Evaluation

## 2019-04-15 NOTE — ED Notes (Signed)
Patient states he will contact his family about his admission. States he has already contacted his Engineer, manufacturing systems he is under house arrest.

## 2019-04-15 NOTE — Transfer of Care (Signed)
Immediate Anesthesia Transfer of Care Note  Patient: Reginald Farmer  Procedure(s) Performed: INCISION AND DRAINAGE NECK ABSCESS (N/A )  Patient Location: PACU  Anesthesia Type:General  Level of Consciousness: awake, alert  and oriented  Airway & Oxygen Therapy: Patient Spontanous Breathing and Patient connected to nasal cannula oxygen  Post-op Assessment: Report given to RN and Post -op Vital signs reviewed and stable  Post vital signs: Reviewed and stable  Last Vitals:  Vitals Value Taken Time  BP 133/76 04/15/19 1611  Temp    Pulse 93 04/15/19 1612  Resp 17 04/15/19 1612  SpO2 93 % 04/15/19 1612  Vitals shown include unvalidated device data.  Last Pain:  Vitals:   04/15/19 0859  TempSrc: Oral  PainSc:          Complications: No apparent anesthesia complications

## 2019-04-15 NOTE — ED Notes (Signed)
Please see downtime charting

## 2019-04-15 NOTE — Progress Notes (Addendum)
Pharmacy Antibiotic Note  Reginald Farmer is a 28 y.o. male admitted on 04/14/2019 with neck  abscess.  Pharmacy has been consulted for Vancomycin and Zosyn dosing for Significant neck cellulitis and abscess.  Plan: Zosyn 3.375 g IV x1 now (30 minute infusion) then 3.375 gm IV 8 hours  (4 hour infusion) Vancomycin 2gm IV x1  Now and F/u on Height to determine further vancomycin doses.      Weight : 221 lbs (102kg)    Temp (24hrs), Avg:99 F (37.2 C), Min:98.6 F (37 C), Max:99.4 F (37.4 C)  Recent Labs  Lab 04/15/19 0058  WBC 12.9*  CREATININE 1.02    CrCl cannot be calculated (Unknown ideal weight.).    No Known Allergies  Antimicrobials this admission: Vanc 6/10>> Zosyn 6/10>>  Dose adjustments this admission:   Microbiology results: 6/10 COVID-19 : send out 6/10 abscess neck: pending  Thank you for allowing pharmacy to be a part of this patient's care. Nicole Cella, RPh Clinical Pharmacist Please check AMION for all [place] phone numbers After 10:00 PM, call [place] [phone] 04/15/2019 4:49 AM    ADDENDUM:  Height: 5\' 6"  (167.6 cm) Weight: 193 lb 9.6 oz (87.8 kg) IBW/kg (Calculated) : 63.8  Temp (24hrs), Avg:98.9 F (37.2 C), Min:98.6 F (37 C), Max:99.4 F (37.4 C)  Recent Labs  Lab 04/15/19 0058 04/15/19 0520  WBC 12.9* 14.4*  CREATININE 1.02 0.99  LATICACIDVEN  --  0.7    Estimated Creatinine Clearance: 117.4 mL/min (by C-G formula based on SCr of 0.99 mg/dL).    PLAN:  Vancomycin 1000 mg IV Q 12 hrs. Goal AUC 400-550. Expected AUC:  511 SCr used: 0.99 Vd used 0.5 L/kg  (for BMI >30)    Nicole Cella, RPh Clinical Pharmacist 04/15/2019 7:42 AM

## 2019-04-15 NOTE — Consult Note (Signed)
[facility] Nurse wound consult note Patient receiving care in [place]. Patient on Contact and Droplet precautions. Reason for Consult: Care of right neck I&D of abscess site Wound type: infectious Wound bed: cannot be visualized Drainage (amount, consistency, odor) dried blood on existing gauze dressing Periwound: indurated, but intact Dressing procedure/placement/frequency: Pack Iodoform gauze 1/4 inch strip Kellie Simmering [phone]) into wound on right neck. Cover with dry gauze. Change twice daily. Monitor the wound area(s) for worsening of condition such as: Signs/symptoms of infection,  Increase in size,  Development of or worsening of odor, Development of pain, or increased pain at the affected locations.  Notify the medical team if any of these develop.  Thank you for the consult.[place] nurse will not follow at this time.  Please re-consult the [place] team if needed.  Val Riles, RN, MSN, CWOCN, CNS-BC, pager [phone]

## 2019-04-15 NOTE — Consult Note (Signed)
[facility] Consult/Admission Note  Reginald Farmer 01-Dec-1990  [id number].    Requesting MD: Dr. Catha Gosselin Chief Complaint/Reason for Consult: neck abscess  HPI:   Pt is a 28 yo male with a hx of GERD and tobacco abuse who presented to the ED yesterday with complaints of neck swelling and pain for a few days. He has never had anything like this before. COVID screen negative, test pending. He noticed a small pimple a few days ago. He has had increased pain and swelling since. I&D in ER by EDP. We were asked to see. Pt is having constant pain, worse with touch. No fever, chills, CP, SOB, other associated symptoms. No difficulty breathing. Pt has never had surgery before. No anticoagulation.   ROS:  Review of Systems  Constitutional: Negative for chills, diaphoresis and fever.  HENT: Negative for sore throat.   Respiratory: Negative for cough and shortness of breath.   Cardiovascular: Negative for chest pain.  Gastrointestinal: Negative for abdominal pain, blood in stool, constipation, diarrhea, nausea and vomiting.  Genitourinary: Negative for dysuria.  Musculoskeletal: Positive for neck pain (swelling and pain to R side of neck).  Skin: Negative for rash.  Neurological: Negative for dizziness and loss of consciousness.  All other systems reviewed and are negative.    Family History  Problem Relation Age of Onset  . Diabetes Mellitus II Maternal Grandmother     Past Medical History:  Diagnosis Date  . Abscess 04/15/2019   right side of neck  . GERD (gastroesophageal reflux disease)   . Tobacco abuse     Past Surgical History:  Procedure Laterality Date  . NO PAST SURGERIES      Social History:  reports that he has been smoking cigarettes. He has never used smokeless tobacco. He reports current drug use. Drug: Marijuana. He reports that he does not drink alcohol.  Allergies: No Known Allergies  Medications Prior to Admission  Medication Sig Dispense Refill   . acetaminophen (TYLENOL) 325 MG tablet Take 650 mg by mouth every 6 (six) hours as needed for mild pain.      Blood pressure 117/79, pulse 76, temperature 99.1 F (37.3 C), temperature source Oral, resp. rate 18, height  (1.676 m), weight 87.8 kg, SpO2 99 %.  Physical Exam Vitals signs reviewed.  Constitutional:      General: He is awake.     Appearance: Normal appearance. He is well-developed. He is not ill-appearing, toxic-appearing or diaphoretic.  HENT:     Head: Normocephalic and atraumatic.     Nose: Nose normal.     Mouth/Throat:     Comments: Pt wearing mask Eyes:     General: Lids are normal.     Conjunctiva/sclera:     Right eye: Right conjunctiva is not injected.     Left eye: Left conjunctiva is not injected.     Pupils: Pupils are equal, round, and reactive to light.  Neck:     Musculoskeletal: Full passive range of motion without pain and normal range of motion.     Thyroid: No thyromegaly.     Comments: Large abscess to R side of neck with large area of induration and erythema, mild purulent drainage. Very TTP Cardiovascular:     Rate and Rhythm: Normal rate and regular rhythm.     Pulses:          Radial pulses are 2+ on the right side and 2+ on the left side.     Heart sounds:  Normal heart sounds, S1 normal and S2 normal.  Pulmonary:     Effort: Pulmonary effort is normal.     Breath sounds: Normal breath sounds. No wheezing, rhonchi or rales.  Abdominal:     General: Bowel sounds are normal. There is no distension.     Palpations: Abdomen is soft.     Tenderness: There is no abdominal tenderness.  Musculoskeletal: Normal range of motion.        General: No tenderness or deformity.  Skin:    General: Skin is warm and dry.  Neurological:     Mental Status: He is alert and oriented to person, place, and time.  Psychiatric:        Mood and Affect: Mood normal.        Behavior: Behavior normal. Behavior is cooperative.     Results for orders  placed or performed during the hospital encounter of 04/14/19 (from the past 48 hour(s))  CBC with Differential/Platelet     Status: Abnormal   Collection Time: 04/15/19 12:58 AM  Result Value Ref Range   WBC 12.9 (H) 4.0 - 10.5 K/uL   RBC 4.85 4.22 - 5.81 MIL/uL   Hemoglobin 13.8 13.0 - 17.0 g/dL   HCT 91.442.8 78.239.0 - 95.652.0 %   MCV 88.2 80.0 - 100.0 fL   MCH 28.5 26.0 - 34.0 pg   MCHC 32.2 30.0 - 36.0 g/dL   RDW 21.314.7 08.611.5 - 57.815.5 %   Platelets 237 150 - 400 K/uL   nRBC 0.0 0.0 - 0.2 %   Neutrophils Relative % 67 %   Neutro Abs 8.8 (H) 1.7 - 7.7 K/uL   Lymphocytes Relative 18 %   Lymphs Abs 2.3 0.7 - 4.0 K/uL   Monocytes Relative 10 %   Monocytes Absolute 1.3 (H) 0.1 - 1.0 K/uL   Eosinophils Relative 3 %   Eosinophils Absolute 0.3 0.0 - 0.5 K/uL   Basophils Relative 1 %   Basophils Absolute 0.1 0.0 - 0.1 K/uL   Immature Granulocytes 1 %   Abs Immature Granulocytes 0.06 0.00 - 0.07 K/uL    Comment: Performed at [facility], [address] [place], [place] [address]  Basic metabolic panel     Status: None   Collection Time: 04/15/19 12:58 AM  Result Value Ref Range   Sodium 136 135 - 145 mmol/L   Potassium 4.1 3.5 - 5.1 mmol/L   Chloride 104 98 - 111 mmol/L   CO2 23 22 - 32 mmol/L   Glucose, Bld 97 70 - 99 mg/dL   BUN 12 6 - 20 mg/dL   Creatinine, Ser 9.521.02 0.61 - 1.24 mg/dL   Calcium 8.9 8.9 - 84.110.3 mg/dL   GFR calc non Af Amer >60 >60 mL/min   GFR calc Af Amer >60 >60 mL/min   Anion gap 9 5 - 15    Comment: Performed at [facility], [address] [place], [place] [address]  Wound or Superficial Culture     Status: None (Preliminary result)   Collection Time: 04/15/19  4:23 AM   Specimen: Abscess; Wound  Result Value Ref Range   Specimen Description ABSCESS NECK    Special Requests NONE    Gram Stain      MODERATE WBC PRESENT, PREDOMINANTLY PMN ABUNDANT GRAM POSITIVE COCCI IN PAIRS Performed at [facility], [address] [place], [place] [address]     Culture PENDING    Report Status PENDING   Lactic  acid, plasma     Status: None   Collection Time: 04/15/19  5:20 AM  Result Value Ref Range   Lactic Acid, Venous 0.7 0.5 - 1.9 mmol/L    Comment: Performed at [facility], [address] [place], [place] [address]  Procalcitonin     Status: None   Collection Time: 04/15/19  5:20 AM  Result Value Ref Range   Procalcitonin <0.10 ng/mL    Comment:        Interpretation: PCT (Procalcitonin) <= 0.5 ng/mL: Systemic infection (sepsis) is not likely. Local bacterial infection is possible. RESULT REPEATED AND VERIFIED (NOTE)       Sepsis PCT Algorithm           Lower Respiratory Tract                                      Infection PCT Algorithm    ----------------------------     ----------------------------         PCT < 0.25 ng/mL                PCT < 0.10 ng/mL         Strongly encourage             Strongly discourage   discontinuation of antibiotics    initiation of antibiotics    ----------------------------     -----------------------------       PCT 0.25 - 0.50 ng/mL            PCT 0.10 - 0.25 ng/mL               OR       >80% decrease in PCT            Discourage initiation of                                            antibiotics      Encourage discontinuation           of antibiotics    ----------------------------     -----------------------------         PCT >= 0.50 ng/mL              PCT 0.26 - 0.50  ng/mL               AND       <80% decrease in PCT             Encourage initiation of                                             antibiotics       Encourage continuation           of antibiotics    ----------------------------     -----------------------------        PCT >= 0.50 ng/mL                  PCT > 0.50 ng/mL               AND         increase in PCT  Strongly encourage                                      initiation of antibiotics    Strongly encourage escalation           of  antibiotics                                     -----------------------------                                           PCT <= 0.25 ng/mL                                                 OR                                        > 80% decrease in PCT                                     Discontinue / Do not initiate                                             antibiotics Performed at [facility], [place] [address], [place], [place] [address]   Basic metabolic panel     Status: Abnormal   Collection Time: 04/15/19  5:20 AM  Result Value Ref Range   Sodium 136 135 - 145 mmol/L   Potassium 3.3 (L) 3.5 - 5.1 mmol/L    Comment: DELTA CHECK NOTED   Chloride 103 98 - 111 mmol/L   CO2 22 22 - 32 mmol/L   Glucose, Bld 100 (H) 70 - 99 mg/dL   BUN 10 6 - 20 mg/dL   Creatinine, Ser 0.99 0.61 - 1.24 mg/dL   Calcium 8.8 (L) 8.9 - 10.3 mg/dL   GFR calc non Af Amer >60 >60 mL/min   GFR calc Af Amer >60 >60 mL/min   Anion gap 11 5 - 15    Comment: Performed at [facility], [place] [address], [place], [place] [address]  CBC     Status: Abnormal   Collection Time: 04/15/19  5:20 AM  Result Value Ref Range   WBC 14.4 (H) 4.0 - 10.5 K/uL   RBC 4.79 4.22 - 5.81 MIL/uL   Hemoglobin 13.7 13.0 - 17.0 g/dL   HCT 42.3 39.0 - 52.0 %   MCV 88.3 80.0 - 100.0 fL   MCH 28.6 26.0 - 34.0 pg   MCHC 32.4 30.0 - 36.0 g/dL   RDW 14.6 11.5 - 15.5 %   Platelets 241 150 - 400 K/uL   nRBC 0.0 0.0 - 0.2 %    Comment: Performed at [facility], [place] [address], [place], [place] [address]  Lactic acid, plasma     Status: None   Collection Time: 04/15/19  8:32 AM  Result Value Ref Range   Lactic Acid, Venous 0.6 0.5 - 1.9 mmol/L    Comment: Performed at [facility], [address] [place], [place] [address]  Protime-INR     Status: Abnormal   Collection Time: 04/15/19  8:32 AM  Result Value Ref Range   Prothrombin Time 15.4 (H) 11.4 - 15.2 seconds   INR 1.2 0.8 - 1.2    Comment: (NOTE) INR  goal varies based on device and disease states. Performed at [facility], [address] [place], [place] [address]   APTT     Status: None   Collection Time: 04/15/19  8:32 AM  Result Value Ref Range   aPTT 30 24 - 36 seconds    Comment: Performed at [facility], [address] [place], [place] [address]   Ct Soft Tissue Neck W Contrast  Result Date: 04/15/2019 CLINICAL DATA:  Initial evaluation for right-sided neck swelling. EXAM: CT NECK WITH CONTRAST TECHNIQUE: Multidetector CT imaging of the neck was performed using the standard protocol following the bolus administration of intravenous contrast. CONTRAST:  75mL OMNIPAQUE IOHEXOL 300 MG/ML  SOLN COMPARISON:  None. FINDINGS: Pharynx and larynx: Examination limited by motion artifact and patient positioning. Oral cavity within normal limits. No acute inflammatory changes about the dentition. Nasopharynx and oropharynx grossly within normal limits, although evaluation limited by motion. No discernible retropharyngeal collection. Epiglottis grossly normal. Remainder of the hypopharynx and supraglottic larynx grossly within normal limits. True cords symmetric and normal. Subglottic airway clear. Salivary glands: Inflammatory changes surround the right parotid gland related to the adjacent inflammatory process within the right neck. Mild extension into the right submandibular space, partially surrounding the right submandibular gland as well. Salivary glands otherwise unremarkable. Thyroid: Negative. Lymph nodes: 12 mm right level II node, likely reactive. No other pathologically enlarged lymph nodes seen within the neck. Vascular: Normal intravascular enhancement seen throughout the neck. Limited intracranial: Unremarkable. Visualized orbits: Visualized globes and orbital soft tissues within normal limits. Mastoids and visualized paranasal sinuses: Visualized paranasal sinuses are clear. Mastoid air cells and middle ear cavities are well  pneumatized and free of fluid. Skeleton: No acute osseous finding. No discrete lytic or blastic osseous lesions. Upper chest: Visualized upper chest demonstrates no acute finding. Partially visualized lungs are clear. Other: Extensive soft tissue swelling with inflammatory stranding seen throughout the right lateral neck, compatible with acute cellulitis. Overlying skin thickening. Inflammatory changes partially surround the right sternocleidomastoid muscle. Extension into the right parotid and submandibular spaces as well as the submental region, then inferiorly along the anterior aspect of the anterior neck towards the upper chest. Partial extension into the right parapharyngeal space as well. There is an ill-defined area of hypodensity involving the subcutaneous fat at the epicenter of this inflammatory process within the right neck measuring approximately 2.5 x 1.4 x 3.8 cm (AP by transverse by craniocaudad, concerning for phlegmon and/or early abscess. This is at the site of maximal soft tissue/skin swelling. IMPRESSION: 1. Extensive soft tissue swelling with inflammatory stranding throughout the right lateral neck, compatible with acute cellulitis. Superimposed 2.5 x 1.4 x 3.8 cm ill-defined hypodensity within the subcutaneous fat at the epicenter of this inflammatory process, concerning for phlegmon and/or early abscess. 2. Mildly enlarged right level II lymph node, likely reactive. Electronically Signed   By: Rise MuBenjamin  McClintock M.D.   On: 04/15/2019 03:29  Assessment/Plan Principal Problem:   Cellulitis and abscess of neck Active Problems:   Tobacco abuse   GERD (gastroesophageal reflux disease)   Cellulitis of neck  Neck abscess - S/P I&D by EDP yesterday - needs further management, will take pt to OR today for I&D  FEN: NPO VTE: SCD's ID: Clindamycin once, Zosyn & Vanc 06/11>>   Foley: none Follow up: TBD  Plan: OR today   Jerre Simon, [facility]  Surgery 04/15/2019, 9:23 AM Pager: [phone] Consults: [phone] Mon-Fri 7:00 am-4:30 pm Sat-Sun 7:00 am-11:30 am

## 2019-04-15 NOTE — ED Provider Notes (Signed)
[facility] EM[facility]ENCY [facility] Provider Note   CSN: [id number] Arrival date & time: 04/14/19  2052     History   Chief Complaint Chief Complaint  Patient presents with  . Abscess    HPI Reginald Farmer is a 28 y.o. male.     Patient presents to the emergency department with a chief complaint of abscess.  He states that he noticed a small pimple on the right side of his neck few days ago.  States that it has significantly increased in size and pain.  He denies any fevers or chills.  Denies any treatments prior to arrival.  It is aggravated with palpation.  He denies any throat swelling sensation or difficulty breathing.  The history is provided by the patient. No language interpreter was used.    History reviewed. No pertinent past medical history.  There are no active problems to display for this patient.   History reviewed. No pertinent surgical history.      Home Medications    Prior to Admission medications   Medication Sig Start Date End Date Taking? Authorizing Provider  omeprazole (PRILOSEC) 20 MG capsule Take 1 capsule (20 mg total) by mouth daily. 10/26/16   Muthersbaugh, Dahlia ClientHannah, PA-C    Family History No family history on file.  Social History Social History   Tobacco Use  . Smoking status: Current Every Day Smoker  Substance Use Topics  . Alcohol use: No  . Drug use: Yes    Types: Marijuana     Allergies   Patient has no known allergies.   Review of Systems Review of Systems  All other systems reviewed and are negative.    Physical Exam Updated Vital Signs BP 118/65 (BP Location: Right Arm)   Pulse 89   Temp 98.6 F (37 C) (Oral)   Resp 18   SpO2 98%   Physical Exam Vitals signs and nursing note reviewed.  Constitutional:      Appearance: He is well-developed.  HENT:     Head: Normocephalic and atraumatic.  Eyes:     Conjunctiva/sclera: Conjunctivae normal.  Neck:     Musculoskeletal: Neck supple.   Comments: Large mass to right side of neck, tender to palpation, small ulceration Cardiovascular:     Rate and Rhythm: Normal rate and regular rhythm.     Heart sounds: No murmur.  Pulmonary:     Effort: Pulmonary effort is normal. No respiratory distress.     Breath sounds: Normal breath sounds.  Abdominal:     Palpations: Abdomen is soft.     Tenderness: There is no abdominal tenderness.  Skin:    General: Skin is warm and dry.  Neurological:     Mental Status: He is alert and oriented to person, place, and time.  Psychiatric:        Mood and Affect: Mood normal.        Behavior: Behavior normal.        Thought Content: Thought content normal.        Judgment: Judgment normal.      ED Treatments / Results  Labs (all labs ordered are listed, but only abnormal results are displayed) Labs Reviewed  CBC WITH DIFFERENTIAL/PLATELET - Abnormal; Notable for the following components:      Result Value   WBC 12.9 (*)    Neutro Abs 8.8 (*)    Monocytes Absolute 1.3 (*)    All other components within normal limits  AEROBIC CULTURE (SUPERFICIAL SPECIMEN)  NOVEL CORONAVIRUS, NAA (HOSPITAL ORDER, SEND-OUT TO REF LAB)  BASIC METABOLIC PANEL    EKG    Radiology Ct Soft Tissue Neck W Contrast  Result Date: 04/15/2019 CLINICAL DATA:  Initial evaluation for right-sided neck swelling. EXAM: CT NECK WITH CONTRAST TECHNIQUE: Multidetector CT imaging of the neck was performed using the standard protocol following the bolus administration of intravenous contrast. CONTRAST:  75mL OMNIPAQUE IOHEXOL 300 MG/ML  SOLN COMPARISON:  None. FINDINGS: Pharynx and larynx: Examination limited by motion artifact and patient positioning. Oral cavity within normal limits. No acute inflammatory changes about the dentition. Nasopharynx and oropharynx grossly within normal limits, although evaluation limited by motion. No discernible retropharyngeal collection. Epiglottis grossly normal. Remainder of the  hypopharynx and supraglottic larynx grossly within normal limits. True cords symmetric and normal. Subglottic airway clear. Salivary glands: Inflammatory changes surround the right parotid gland related to the adjacent inflammatory process within the right neck. Mild extension into the right submandibular space, partially surrounding the right submandibular gland as well. Salivary glands otherwise unremarkable. Thyroid: Negative. Lymph nodes: 12 mm right level II node, likely reactive. No other pathologically enlarged lymph nodes seen within the neck. Vascular: Normal intravascular enhancement seen throughout the neck. Limited intracranial: Unremarkable. Visualized orbits: Visualized globes and orbital soft tissues within normal limits. Mastoids and visualized paranasal sinuses: Visualized paranasal sinuses are clear. Mastoid air cells and middle ear cavities are well pneumatized and free of fluid. Skeleton: No acute osseous finding. No discrete lytic or blastic osseous lesions. Upper chest: Visualized upper chest demonstrates no acute finding. Partially visualized lungs are clear. Other: Extensive soft tissue swelling with inflammatory stranding seen throughout the right lateral neck, compatible with acute cellulitis. Overlying skin thickening. Inflammatory changes partially surround the right sternocleidomastoid muscle. Extension into the right parotid and submandibular spaces as well as the submental region, then inferiorly along the anterior aspect of the anterior neck towards the upper chest. Partial extension into the right parapharyngeal space as well. There is an ill-defined area of hypodensity involving the subcutaneous fat at the epicenter of this inflammatory process within the right neck measuring approximately 2.5 x 1.4 x 3.8 cm (AP by transverse by craniocaudad, concerning for phlegmon and/or early abscess. This is at the site of maximal soft tissue/skin swelling. IMPRESSION: 1. Extensive soft tissue  swelling with inflammatory stranding throughout the right lateral neck, compatible with acute cellulitis. Superimposed 2.5 x 1.4 x 3.8 cm ill-defined hypodensity within the subcutaneous fat at the epicenter of this inflammatory process, concerning for phlegmon and/or early abscess. 2. Mildly enlarged right level II lymph node, likely reactive. Electronically Signed   By: Rise MuBenjamin  McClintock M.D.   On: 04/15/2019 03:29    Procedures Procedures (including critical care time) INCISION AND DRAINAGE Performed by: Roxy Horsemanobert Nathon Stefanski Consent: Verbal consent obtained. Risks and benefits: risks, benefits and alternatives were discussed Type: abscess  Body area: lateral neck  Anesthesia: local infiltration  Incision was made with a scalpel.  Local anesthetic: lidocaine 1% with epinephrine  Anesthetic total: 3 ml  Complexity: complex Blunt dissection to break up loculations  Drainage: purulent  Drainage amount: moderate  Packing material: none  Patient tolerance: Patient tolerated the procedure well with no immediate complications.    Medications Ordered in ED Medications  oxyCODONE-acetaminophen (PERCOCET/ROXICET) 5-325 MG per tablet 1 tablet (1 tablet Oral Given 04/14/19 2059)  clindamycin (CLEOCIN) IVPB 900 mg (900 mg Intravenous New Bag/Given 04/15/19 0409)  morphine 4 MG/ML injection 4 mg (4 mg Intravenous Given 04/15/19 0059)  iohexol (  OMNIPAQUE) 300 MG/ML solution 75 mL (75 mLs Intravenous Contrast Given 04/15/19 0255)  lidocaine-EPINEPHrine (XYLOCAINE W/EPI) 2 %-1:200000 (PF) injection 10 mL (10 mLs Infiltration Given 04/15/19 0422)     Initial Impression / Assessment and Plan / ED Course  I have reviewed the triage vital signs and the nursing notes.  Pertinent labs & imaging results that were available during my care of the patient were reviewed by me and considered in my medical decision making (see chart for details).       Patient with large mass on the right side of his  neck.  Concerning for abscess versus cellulitis versus other.  CT scan shows large area of cellulitis and small abscess.  I was able to incise and drain the abscessed portion.  However, given the size and depth of the cellulitic tissue, feel that observation admission is indicated.  Patient seen by and discussed with Dr. Leonides Schanz, who agrees with the plan.  Consult hospitalist.  Appreciate Dr. Blaine Hamper for bringing patient into the hospital.  Final Clinical Impressions(s) / ED Diagnoses   Final diagnoses:  Cellulitis of neck    ED Discharge Orders    None       Montine Circle, PA-C 04/15/19 [place], Delice Bison, DO 04/15/19 0503    Ward, Delice Bison, DO 04/15/19 0045

## 2019-04-16 ENCOUNTER — Encounter (HOSPITAL_COMMUNITY): Payer: Self-pay | Admitting: Surgery

## 2019-04-16 LAB — BASIC METABOLIC PANEL
Anion gap: 11 (ref 5–15)
BUN: 11 mg/dL (ref 6–20)
CO2: 24 mmol/L (ref 22–32)
Calcium: 8.7 mg/dL — ABNORMAL LOW (ref 8.9–10.3)
Chloride: 105 mmol/L (ref 98–111)
Creatinine, Ser: 0.99 mg/dL (ref 0.61–1.24)
GFR calc Af Amer: >60 mL/min (ref >60)
GFR calc non Af Amer: >60 mL/min (ref >60)
Glucose, Bld: 105 mg/dL — ABNORMAL HIGH (ref 70–99)
Potassium: 3.7 mmol/L (ref 3.5–5.1)
Sodium: 140 mmol/L (ref 135–145)

## 2019-04-16 LAB — CBC
HCT: 43.9 % (ref 39.0–52.0)
Hemoglobin: 13.9 g/dL (ref 13.0–17.0)
MCH: 28.4 pg (ref 26.0–34.0)
MCHC: 31.7 g/dL (ref 30.0–36.0)
MCV: 89.6 fL (ref 80.0–100.0)
Platelets: 239 10*3/uL (ref 150–400)
RBC: 4.9 MIL/uL (ref 4.22–5.81)
RDW: 14.5 % (ref 11.5–15.5)
WBC: 17.1 10*3/uL — ABNORMAL HIGH (ref 4.0–10.5)
nRBC: 0 % (ref 0.0–0.2)

## 2019-04-16 LAB — NOVEL CORONAVIRUS, NAA (HOSP ORDER, SEND-OUT TO REF LAB; TAT 18-24 HRS): SARS-CoV-2, NAA: NOT DETECTED

## 2019-04-16 MED ORDER — ENOXAPARIN SODIUM 40 MG/0.4ML ~~LOC~~ SOLN
40.0000 mg | Freq: Every day | SUBCUTANEOUS | Status: DC
  Filled 2019-04-16: qty 0.4

## 2019-04-16 NOTE — Progress Notes (Signed)
PROGRESS NOTE    Reginald Farmer  QBH:[id number] DOB: [birth year] DOA: 04/14/2019 PCP: Patient, No Pcp Per   Brief Narrative:  HPI on 04/15/2019 by Dr. Ivor Costa Reginald Farmer is a 28 y.o. male with medical history significant of GERD, tobacco abuse, who presents with neck pain and neck abscess.  Pt states he has been having right sided neck pain and neck abscess for several day. The pain is constant, severe, sharp, nonradiating.He states that he initally noticed a small pimple on the right side of his neck few days ago. It has been gradually increasing in size, and more painful and swollen. Patient does not have fever or chills.  Denies any chest pain, shortness of breath.  No nausea vomiting, diarrhea, abdominal pain, symptoms of UTI or unilateral weakness. He denies any throat swelling sensation or difficulty breathing.  Interim history Admitted for abscess/cellulitis of the neck. Surgery consulted and s/p I&D. Continue IV antibiotics.  Assessment & Plan    Cellulitis and abscess of Neck, right -S/p partial drainage by EDP -Continues to have significant edema and pain with increasing leukocytosis -CT soft tissue neck: Symptoms soft tissue swelling with inflammatory stranding throughout the right lateral neck, compatible with acute cellulitis.  2.5 x 1.4 x 3.8 cm ill-defined hypodensity within the subcutaneous fat at the epicenter of this inflammatory process concerning for phlegmon and/or early abscess. -Blood cultures show no growth to date -Wound culture: Abundant GPC in pairs  -ESR 7 (WNL) and CRP 4 (elevated) -Continue vancomycin and zosyn -General surgery consulted and appreciated, s/p Sharp incision and debridement of right neck abscess (skin and subcutaneous tissues 2 x 2 x 1.5 cm) -Continue pain control  Leukocytosis -likely secondary to the above -mildly worse today, 17 continue IV antibiotics and monitor CBC  Tobacco abuse -Continue nicotine patch  GERD -Continue  PPI   COVID test negative.  Patient currently asymptomatic.  PPE worn: Gown, gloves, face shield, surgical mask  DVT Prophylaxis  SCDs  Code Status: Full  Family Communication: None at bedside  Disposition Plan: Admitted. Continue IV antibiotics. Pending culture results and improvement in WBC. Suspect home in 24-48hrs.  Consultants General surgery  Procedures  None  Antibiotics   Anti-infectives (From admission, onward)   Start     Dose/Rate Route Frequency Ordered Stop   04/15/19 2000  vancomycin (VANCOCIN) IVPB 1000 mg/200 mL premix     1,000 mg 200 mL/hr over 60 Minutes Intravenous Every 12 hours 04/15/19 0753     04/15/19 1400  piperacillin-tazobactam (ZOSYN) IVPB 3.375 g     3.375 g 12.5 mL/hr over 240 Minutes Intravenous Every 8 hours 04/15/19 0502     04/15/19 0600  vancomycin (VANCOCIN) 2,000 mg in sodium chloride 0.9 % 500 mL IVPB     2,000 mg 250 mL/hr over 120 Minutes Intravenous  Once 04/15/19 0503 04/15/19 0849   04/15/19 0530  piperacillin-tazobactam (ZOSYN) IVPB 3.375 g     3.375 g 100 mL/hr over 30 Minutes Intravenous  Once 04/15/19 0502 04/15/19 0711   04/15/19 0345  clindamycin (CLEOCIN) IVPB 900 mg     900 mg 100 mL/hr over 30 Minutes Intravenous  Once 04/15/19 0340 04/15/19 0441      Subjective:   [name] seen and examined today.  Feels pain and swelling on the right side of his neck has improved since surgery.  Denies current chest pain, shortness of breath, abdominal pain, nausea vomiting, diarrhea constipation, dizziness or headache.  Objective:   Vitals:  04/15/19 1718 04/15/19 2004 04/16/19 0410 04/16/19 0908  BP: 129/77 120/68 100/63 (!) 109/54  Pulse: 85 94 66 76  Resp: _0 Temp: 98.9 F (37.2 C) 98.4 F (36.9 C) 98 F (36.7 C) 98.4 F (36.9 C)  TempSrc: Oral Oral Oral Oral  SpO2: 97% 98% 100% 95%  Weight:   87.4 kg   Height:        Intake/Output Summary (Last 24 hours) at 04/16/2019 1015 Last data filed at  04/16/2019 0900 Gross per 24 hour  Intake 2212.08 ml  Output 5 ml  Net 2207.08 ml   Filed Weights   04/15/19 0614 04/16/19 0410  Weight: 87.8 kg 87.4 kg   Exam  General: Well developed, well nourished, NAD, appears stated age  70: NCAT, mucous membranes moist.   Neck: R Neck with dressing in place, improved edema, induration and erythema as compared to prior day.  Cardiovascular: S1 S2 auscultated, murmur, RRR  Respiratory: Clear to auscultation bilaterally with equal chest rise  Abdomen: Soft, nontender, nondistended, + bowel sounds  Extremities: warm dry without cyanosis clubbing or edema  Neuro: AAOx3, nonfocal  Psych: Pleasant, appropriate mood and affect  Data Reviewed: I have personally reviewed following labs and imaging studies  CBC: Recent Labs  Lab 04/15/19 0058 04/15/19 0520 04/16/19 0502  WBC 12.9* 14.4* 17.1*  NEUTROABS 8.8*  --   --   HGB 13.8 13.7 13.9  HCT 42.8 42.3 43.9  MCV 88.2 88.3 89.6  PLT 237 241 675   Basic Metabolic Panel: Recent Labs  Lab 04/15/19 0058 04/15/19 0520 04/16/19 0502  NA 136 136 140  K 4.1 3.3* 3.7  CL 104 103 105  CO2 _1 GLUCOSE 97 100* 105*  BUN _2 CREATININE 1.02 0.99 0.99  CALCIUM 8.9 8.8* 8.7*   GFR: Estimated Creatinine Clearance: 117.1 mL/min (by C-G formula based on SCr of 0.99 mg/dL). Liver Function Tests: No results for input(s): AST, ALT, ALKPHOS, BILITOT, PROT, ALBUMIN in the last 168 hours. No results for input(s): LIPASE, AMYLASE in the last 168 hours. No results for input(s): AMMONIA in the last 168 hours. Coagulation Profile: Recent Labs  Lab 04/15/19 0832  INR 1.2   Cardiac Enzymes: No results for input(s): CKTOTAL, CKMB, CKMBINDEX, TROPONINI in the last 168 hours. BNP (last 3 results) No results for input(s): PROBNP in the last 8760 hours. HbA1C: No results for input(s): HGBA1C in the last 72 hours. CBG: No results for input(s): GLUCAP in the last 168 hours. Lipid  Profile: No results for input(s): CHOL, HDL, LDLCALC, TRIG, CHOLHDL, LDLDIRECT in the last 72 hours. Thyroid Function Tests: No results for input(s): TSH, T4TOTAL, FREET4, T3FREE, THYROIDAB in the last 72 hours. Anemia Panel: No results for input(s): VITAMINB12, FOLATE, FERRITIN, TIBC, IRON, RETICCTPCT in the last 72 hours. Urine analysis:    Component Value Date/Time   COLORURINE YELLOW 05/14/2018 [place] 05/14/2018 0545   LABSPEC 1.030 05/14/2018 0545   PHURINE 5.0 05/14/2018 0545   GLUCOSEU NEGATIVE 05/14/2018 0545   HGBUR NEGATIVE 05/14/2018 0545   BILIRUBINUR NEGATIVE 05/14/2018 0545   KETONESUR NEGATIVE 05/14/2018 0545   PROTEINUR NEGATIVE 05/14/2018 0545   UROBILINOGEN 0.2 09/08/2010 0912   NITRITE NEGATIVE 05/14/2018 0545   LEUKOCYTESUR NEGATIVE 05/14/2018 0545   Sepsis Labs: _3 (procalcitonin:4,lacticidven:4)  ) Recent Results (from the past 240 hour(s))  Novel Coronavirus,NAA,(SEND-OUT TO REF LAB - TAT 24-48 hrs); Hosp Order     Status: None  Collection Time: 04/15/19  3:55 AM   Specimen: Abscess; Respiratory  Result Value Ref Range Status   SARS-CoV-2, NAA NOT DETECTED NOT DETECTED Final    Comment: (NOTE) This test was developed and its performance characteristics determined by [employer]. This test has not been FDA cleared or approved. This test has been authorized by FDA under an Emergency Use Authorization (EUA). This test is only authorized for the duration of time the declaration that circumstances exist justifying the authorization of the emergency use of in vitro diagnostic tests for detection of SARS-CoV-2 virus and/or diagnosis of COVID-19 infection under section 564(b)(1) of the Act, 21 U.S.C. 287GOT-1(X)(7), unless the authorization is terminated or revoked sooner. When diagnostic testing is negative, the possibility of a false negative result should be considered in the context of a patient's recent exposures and  the presence of clinical signs and symptoms consistent with COVID-19. An individual without symptoms of COVID-19 and who is not shedding SARS-CoV-2 virus would expect to have a negative (not detected) result in this assay. Performed  At: [facility] [address] [place], [place] [id number] Rush Farmer MD CB:[id number]    [place]  Final    Comment: Performed at [facility], [place] [address], [place], [place] [address]  Wound or Superficial Culture     Status: None (Preliminary result)   Collection Time: 04/15/19  4:23 AM   Specimen: Abscess; Wound  Result Value Ref Range Status   Specimen Description ABSCESS NECK  Final   Special Requests NONE  Final   Gram Stain   Final    MODERATE WBC PRESENT, PREDOMINANTLY PMN ABUNDANT GRAM POSITIVE COCCI IN PAIRS Performed at [facility], [address] [place], [place] [address]    Culture PENDING  Incomplete   Report Status PENDING  Incomplete  Culture, blood (x 2)     Status: None (Preliminary result)   Collection Time: 04/15/19  4:45 AM   Specimen: BLOOD  Result Value Ref Range Status   Specimen Description BLOOD RIGHT ARM  Final   Special Requests   Final    BOTTLES DRAWN AEROBIC ONLY Blood Culture adequate volume   Culture   Final    NO GROWTH 1 DAY Performed at [facility], [place] [address] [place], [place] [address]    Report Status PENDING  Incomplete  Culture, blood (x 2)     Status: None (Preliminary result)   Collection Time: 04/15/19  5:02 AM   Specimen: BLOOD LEFT HAND  Result Value Ref Range Status   Specimen Description BLOOD LEFT HAND  Final   Special Requests   Final    BOTTLES DRAWN AEROBIC AND ANAEROBIC Blood Culture results may not be optimal due to an excessive volume of blood received in culture bottles   Culture   Final    NO GROWTH 1 DAY Performed at [facility], [place] [address], [place], [place] [address]    Report Status PENDING  Incomplete    MRSA PCR Screening     Status: None   Collection Time: 04/15/19 12:27 PM   Specimen: Nasal Mucosa; Nasopharyngeal  Result Value Ref Range Status   MRSA by PCR NEGATIVE NEGATIVE Final    Comment:        The GeneXpert MRSA Assay (FDA approved for NASAL specimens only), is one component of a comprehensive MRSA colonization surveillance program. It is not intended to diagnose MRSA infection nor to guide or monitor treatment  for MRSA infections. Performed at [facility], [place] [address] [place], [place] [address]   SARS Coronavirus 2     Status: None   Collection Time: 04/15/19 12:29 PM  Result Value Ref Range Status   SARS Coronavirus 2 NOT DETECTED NOT DETECTED Final    Comment: (NOTE) SARS-CoV-2 target nucleic acids are NOT DETECTED. The SARS-CoV-2 RNA is generally detectable in upper and lower respiratory specimens during the acute phase of infection.  Negative  results do not preclude SARS-CoV-2 infection, do not rule out co-infections with other pathogens, and should not be used as the sole basis for treatment or other patient management decisions.  Negative results must be combined with clinical observations, patient history, and epidemiological information. The expected result is Not Detected. Fact Sheet for Patients: http://www.biofiredefense.com/wp-content/uploads/2020/03/BIOFIRE-COVID -19-patients.pdf Fact Sheet for Healthcare Providers: http://www.biofiredefense.com/wp-content/uploads/2020/03/BIOFIRE-COVID -19-hcp.pdf This test is not yet approved or cleared by the [place] and  has been authorized for detection and/or diagnosis of SARS-CoV-2 by FDA under an Emergency Use Authorization (EUA).  This EUA will remain in effec t (meaning this test can be used) for the duration of  the COVID-19 declaration under Section 564(b)(1) of the Act, 21 U.S.C. section 360bbb-3(b)(1), unless the authorization is terminated or revoked sooner. Performed at [facility], [place] [address] [place], [place] [address]       Radiology Studies: Ct Soft Tissue Neck W Contrast  Result Date: 04/15/2019 CLINICAL DATA:  Initial evaluation for right-sided neck swelling. EXAM: CT NECK WITH CONTRAST TECHNIQUE: Multidetector CT imaging of the neck was performed using the standard protocol following the bolus administration of intravenous contrast. CONTRAST:  46m OMNIPAQUE IOHEXOL 300 MG/ML  SOLN COMPARISON:  None. FINDINGS: Pharynx and larynx: Examination limited by motion artifact and patient positioning. Oral cavity within normal limits. No acute inflammatory changes about the dentition. Nasopharynx and oropharynx grossly within normal limits, although evaluation limited by motion. No discernible retropharyngeal collection. Epiglottis grossly normal. Remainder of the hypopharynx and supraglottic larynx grossly within normal limits. True cords symmetric and normal. Subglottic airway clear. Salivary glands: Inflammatory changes surround the right parotid gland related to the adjacent inflammatory process within the right neck. Mild extension into the right submandibular space, partially surrounding the right submandibular gland as well. Salivary glands otherwise unremarkable. Thyroid: Negative. Lymph nodes: 12 mm right level II node, likely reactive. No other pathologically enlarged lymph nodes seen within the neck. Vascular: Normal intravascular enhancement seen throughout the neck. Limited intracranial: Unremarkable. Visualized orbits: Visualized globes and orbital soft tissues within normal limits. Mastoids and visualized paranasal sinuses: Visualized paranasal sinuses are clear. Mastoid air cells and middle ear cavities are well pneumatized and free of fluid. Skeleton: No acute osseous finding. No discrete lytic or blastic osseous lesions. Upper chest: Visualized upper chest demonstrates no acute finding. Partially visualized lungs are clear. Other: Extensive soft tissue  swelling with inflammatory stranding seen throughout the right lateral neck, compatible with acute cellulitis. Overlying skin thickening. Inflammatory changes partially surround the right sternocleidomastoid muscle. Extension into the right parotid and submandibular spaces as well as the submental region, then inferiorly along the anterior aspect of the anterior neck towards the upper chest. Partial extension into the right parapharyngeal space as well. There is an ill-defined area of hypodensity involving the subcutaneous fat at the epicenter of this inflammatory process within the right neck measuring approximately 2.5 x 1.4 x 3.8 cm (AP by transverse by craniocaudad, concerning for phlegmon and/or early abscess. This is  at the site of maximal soft tissue/skin swelling. IMPRESSION: 1. Extensive soft tissue swelling with inflammatory stranding throughout the right lateral neck, compatible with acute cellulitis. Superimposed 2.5 x 1.4 x 3.8 cm ill-defined hypodensity within the subcutaneous fat at the epicenter of this inflammatory process, concerning for phlegmon and/or early abscess. 2. Mildly enlarged right level II lymph node, likely reactive. Electronically Signed   By: Jeannine Boga M.D.   On: 04/15/2019 03:29     Scheduled Meds:  enoxaparin (LOVENOX) injection  40 mg Subcutaneous Q24H   nicotine  21 mg Transdermal Daily   pantoprazole  40 mg Oral Daily   Continuous Infusions:  sodium chloride 125 mL/hr at 04/15/19 1944   lactated ringers 10 mL/hr at 04/15/19 1136   piperacillin-tazobactam (ZOSYN)  IV 3.375 g (04/16/19 0512)   vancomycin 1,000 mg (04/16/19 0906)     LOS: 1 day   Time Spent in minutes   30 minutes  Alphia Behanna D.O. on 04/16/2019 at 10:15 AM  Between 7am to 7pm - Please see pager noted on [email or web address]  After 7pm go to [email or web address]  And look for the night coverage person covering for me after hours  [facility] Office  [phone]

## 2019-04-16 NOTE — Plan of Care (Signed)
  Problem: Education: Goal: Knowledge of General Education information will improve Description: Including pain rating scale, medication(s)/side effects and non-pharmacologic comfort measures Outcome: Completed/Met

## 2019-04-16 NOTE — Discharge Instructions (Signed)
WOUND CARE: °- dressing to be changed twice daily °- supplies: sterile saline, kerlix, scissors, ABD pads, tape  °- remove dressing and all packing carefully, moistening with sterile saline as needed to avoid packing/internal dressing sticking to the wound. °- clean edges of skin around the wound with water/gauze, making sure there is no tape debris or leakage left on skin that could cause skin irritation or breakdown. °- dampen and clean kerlix with sterile saline and pack wound from wound base to skin level, making sure to take note of any possible areas of wound tracking, tunneling and packing appropriately. Wound can be packed loosely. Trim kerlix to size if a whole kerlix is not required. °- cover wound with a dry ABD pad and secure with tape.  °- write the date/time on the dry dressing/tape to better track when the last dressing change occurred. °- apply any skin protectant/powder recommended by clinician to protect skin/skin folds. °- change dressing as needed if leakage occurs, wound gets contaminated, or patient requests to shower. °- patient may shower daily with wound open and following the shower the wound should be dried and a clean dressing placed.  ° ° ° °1. PAIN CONTROL:  °1. It is helpful to take an over-the-counter pain medication regularly for the first few weeks. Choose one of the following that works best for you:  °i. Naproxen (Aleve, etc) Two 220mg tabs twice a day °ii. Ibuprofen (Advil, etc) Three 200mg tabs four times a day (every meal & bedtime) °iii. Acetaminophen (Tylenol, etc) 500-650mg four times a day (every meal & bedtime) °2. A prescription for pain medication (such as oxycodone, hydrocodone, etc) may be given to you upon discharge. Take your pain medication as prescribed.  °i. If you are having problems/concerns with the prescription medicine (does not control pain, nausea, vomiting, rash, itching, etc), please call us [phone] to see if we need to switch you to a different  pain medicine that will work better for you and/or control your side effect better. °ii. If you need a refill on your pain medication, please contact your pharmacy. They will contact our office to request authorization. Prescriptions will not be filled after 5 pm or on week-ends. °1. Avoid getting constipated. When taking pain medications, it is common to experience some constipation. Increasing fluid intake and taking a fiber supplement (such as Metamucil, Citrucel, FiberCon, MiraLax, etc) 1-2 times a day regularly will usually help prevent this problem from occurring. A mild laxative (prune juice, Milk of Magnesia, MiraLax, etc) should be taken according to package directions if there are no bowel movements after 48 hours.  °2. Watch out for diarrhea. If you have many loose bowel movements, simplify your diet to bland foods & liquids for a few days. Stop any stool softeners and decrease your fiber supplement. Switching to mild anti-diarrheal medications (Kayopectate, Pepto Bismol) can help. If this worsens or does not improve, please call us. °3. Shower daily but do not bathe until your wounds heal. Cover your wounds with clean gauze and tape after showering.  °4. FOLLOW UP  °a. If a follow up appointment is needed one will be scheduled for you. Please call [facility] at [phone] if you have any questions about follow up.  ° °WHEN TO CALL US [phone]:  °1. Poor pain control °2. Reactions / problems with new medications (rash/itching, nausea, etc)  °3. Fever over 101.5 F (38.5 C) °4. Worsening swelling or bruising °5. Redness, swelling, foul discharge or increased pain   from wounds °6. Productive cough, difficulty breathing or any other concerning symptoms ° °The clinic staff is available to answer your questions during regular business hours (8:30am-5pm). Please don’t hesitate to call and ask to speak to one of our nurses for clinical concerns.  °If you have a medical emergency, go to the nearest emergency  room or call 911.  °A surgeon from [facility] is always on call at the hospitals  ° °[facility]  °[address], [address], [place], [place] [address] ?  °MAIN: [phone] ? TOLL FREE: [phone] ?  °FAX [phone]  °[email or web address] ° °

## 2019-04-16 NOTE — Progress Notes (Addendum)
[facility]/Trauma Progress Note  1 Day Post-Op   Assessment/Plan Neck abscess - S/P I&D by EDP 06/10 - S/P Sharp incision and debridement of right neck abscess, Dr. Georgette Dover, 06/11 - continue IV abx - BID wet to dry dressing changes   FEN: reg diet VTE: SCD's, lovenox ID: Clindamycin once, Zosyn & Vanc 06/11>>   Foley: none Follow up: 2 weeks CCS for wound check  Plan: cultures pending, continue IV abx. Monitor WBC.     LOS: 1 day    Subjective: CC: R neck abscess  Pain well controlled. No issues overnight. No difficulty swallowing.   Objective: Vital signs in last 24 hours: Temp:  [98 F (36.7 C)-98.9 F (37.2 C)] 98.4 F (36.9 C) (06/12 0908) Pulse Rate:  [66-104] 76 (06/12 0908) Resp:  [16-20] 20 (06/12 0908) BP: (100-133)/(54-81) 109/54 (06/12 0908) SpO2:  [89 %-100 %] 95 % (06/12 0908) Weight:  [87.4 kg] 87.4 kg (06/12 0410)    Intake/Output from previous day: 06/11 0701 - 06/12 0700 In: 1852.1 [P.O.:360; I.V.:1234.6; IV Piggyback:257.5] Out: 5 [Blood:5] Intake/Output this shift: Total I/O In: 360 [P.O.:360] Out: -   PE: Gen:  Alert, NAD, pleasant, cooperative Neck: R abscess cavity packed, improved surrounding edema, induration and erythema.  Pulm:  Rate and effort normal Skin: no rashes noted, warm and dry   Anti-infectives: Anti-infectives (From admission, onward)   Start     Dose/Rate Route Frequency Ordered Stop   04/15/19 2000  vancomycin (VANCOCIN) IVPB 1000 mg/200 mL premix     1,000 mg 200 mL/hr over 60 Minutes Intravenous Every 12 hours 04/15/19 0753     04/15/19 1400  piperacillin-tazobactam (ZOSYN) IVPB 3.375 g     3.375 g 12.5 mL/hr over 240 Minutes Intravenous Every 8 hours 04/15/19 0502     04/15/19 0600  vancomycin (VANCOCIN) 2,000 mg in sodium chloride 0.9 % 500 mL IVPB     2,000 mg 250 mL/hr over 120 Minutes Intravenous  Once 04/15/19 0503 04/15/19 0849   04/15/19 0530  piperacillin-tazobactam (ZOSYN) IVPB 3.375  g     3.375 g 100 mL/hr over 30 Minutes Intravenous  Once 04/15/19 0502 04/15/19 0711   04/15/19 0345  clindamycin (CLEOCIN) IVPB 900 mg     900 mg 100 mL/hr over 30 Minutes Intravenous  Once 04/15/19 0340 04/15/19 0441      Lab Results:  Recent Labs    04/15/19 0520 04/16/19 0502  WBC 14.4* 17.1*  HGB 13.7 13.9  HCT 42.3 43.9  PLT 241 239   BMET Recent Labs    04/15/19 0520 04/16/19 0502  NA 136 140  K 3.3* 3.7  CL 103 105  CO2 22 24  GLUCOSE 100* 105*  BUN 10 11  CREATININE 0.99 0.99  CALCIUM 8.8* 8.7*   PT/INR Recent Labs    04/15/19 0832  LABPROT 15.4*  INR 1.2   CMP     Component Value Date/Time   NA 140 04/16/2019 0502   K 3.7 04/16/2019 0502   CL 105 04/16/2019 0502   CO2 24 04/16/2019 0502   GLUCOSE 105 (H) 04/16/2019 0502   BUN 11 04/16/2019 0502   CREATININE 0.99 04/16/2019 0502   CALCIUM 8.7 (L) 04/16/2019 0502   PROT 7.3 05/14/2018 0551   ALBUMIN 4.3 05/14/2018 0551   AST 16 05/14/2018 0551   ALT 9 05/14/2018 0551   ALKPHOS 51 05/14/2018 0551   BILITOT 0.9 05/14/2018 0551   GFRNONAA >60 04/16/2019 0502   GFRAA >60 04/16/2019  0502   Lipase     Component Value Date/Time   LIPASE 35 05/14/2018 0551    Studies/Results: Ct Soft Tissue Neck W Contrast  Result Date: 04/15/2019 CLINICAL DATA:  Initial evaluation for right-sided neck swelling. EXAM: CT NECK WITH CONTRAST TECHNIQUE: Multidetector CT imaging of the neck was performed using the standard protocol following the bolus administration of intravenous contrast. CONTRAST:  75mL OMNIPAQUE IOHEXOL 300 MG/ML  SOLN COMPARISON:  None. FINDINGS: Pharynx and larynx: Examination limited by motion artifact and patient positioning. Oral cavity within normal limits. No acute inflammatory changes about the dentition. Nasopharynx and oropharynx grossly within normal limits, although evaluation limited by motion. No discernible retropharyngeal collection. Epiglottis grossly normal. Remainder of the  hypopharynx and supraglottic larynx grossly within normal limits. True cords symmetric and normal. Subglottic airway clear. Salivary glands: Inflammatory changes surround the right parotid gland related to the adjacent inflammatory process within the right neck. Mild extension into the right submandibular space, partially surrounding the right submandibular gland as well. Salivary glands otherwise unremarkable. Thyroid: Negative. Lymph nodes: 12 mm right level II node, likely reactive. No other pathologically enlarged lymph nodes seen within the neck. Vascular: Normal intravascular enhancement seen throughout the neck. Limited intracranial: Unremarkable. Visualized orbits: Visualized globes and orbital soft tissues within normal limits. Mastoids and visualized paranasal sinuses: Visualized paranasal sinuses are clear. Mastoid air cells and middle ear cavities are well pneumatized and free of fluid. Skeleton: No acute osseous finding. No discrete lytic or blastic osseous lesions. Upper chest: Visualized upper chest demonstrates no acute finding. Partially visualized lungs are clear. Other: Extensive soft tissue swelling with inflammatory stranding seen throughout the right lateral neck, compatible with acute cellulitis. Overlying skin thickening. Inflammatory changes partially surround the right sternocleidomastoid muscle. Extension into the right parotid and submandibular spaces as well as the submental region, then inferiorly along the anterior aspect of the anterior neck towards the upper chest. Partial extension into the right parapharyngeal space as well. There is an ill-defined area of hypodensity involving the subcutaneous fat at the epicenter of this inflammatory process within the right neck measuring approximately 2.5 x 1.4 x 3.8 cm (AP by transverse by craniocaudad, concerning for phlegmon and/or early abscess. This is at the site of maximal soft tissue/skin swelling. IMPRESSION: 1. Extensive soft tissue  swelling with inflammatory stranding throughout the right lateral neck, compatible with acute cellulitis. Superimposed 2.5 x 1.4 x 3.8 cm ill-defined hypodensity within the subcutaneous fat at the epicenter of this inflammatory process, concerning for phlegmon and/or early abscess. 2. Mildly enlarged right level II lymph node, likely reactive. Electronically Signed   By: Rise MuBenjamin  McClintock M.D.   On: 04/15/2019 03:29      Jerre SimonJessica L Zykerria Tanton , [facility] 04/16/2019, 10:06 AM  Pager: [phone] Mon-Wed, Friday 7:00am-4:30pm Thurs 7am-11:30am  Consults: [phone]

## 2019-04-16 NOTE — TOC Initial Note (Signed)
Transition of Care [facility]) - Initial/Assessment Note    Patient Details  Name: Reginald Farmer MRN: [id number] Date of Birth: 04/18/1991  [facility] of [facility]) CM/SW Contact:    Bartholomew Crews, RN Phone Number: [phone] 04/16/2019, 5:37 PM  Clinical Narrative:                 Spoke with patient at the bedside briefly. Advised of hospital f/u appointment scheduled with [facility] - patient is agreeable. Discussed ability to pick up prescription if prescribed for outpatient - patient denies any problems with this. CM to follow for transition of care needs.   Expected Discharge Plan: Home/Self Care Barriers to Discharge: Continued Medical Work up   Patient Goals and CMS Choice        Expected Discharge Plan and Services Expected Discharge Plan: Home/Self Care                                              Prior Living Arrangements/Services                       Activities of Daily Living Home Assistive Devices/Equipment: None ADL Screening (condition at time of admission) Patient's cognitive ability adequate to safely complete daily activities?: Yes Is the patient deaf or have difficulty hearing?: No Does the patient have difficulty seeing, even when wearing glasses/contacts?: No Does the patient have difficulty concentrating, remembering, or making decisions?: No Patient able to express need for assistance with ADLs?: No Does the patient have difficulty dressing or bathing?: No Independently performs ADLs?: Yes (appropriate for developmental age) Does the patient have difficulty walking or climbing stairs?: No Weakness of Legs: None Weakness of Arms/Hands: None  Permission Sought/Granted                  Emotional Assessment              Admission diagnosis:  Cellulitis of neck [L03.221] Patient Active Problem List   Diagnosis Date Noted  . Cellulitis and abscess of neck 04/15/2019  . Neck abscess 04/15/2019  . Tobacco abuse   . GERD  (gastroesophageal reflux disease)   . Cellulitis of neck    PCP:  Patient, No Pcp Per Pharmacy:   [id number] - [place], [place] [address]  [place] [address] Phone: [phone] Fax: [phone]     Social Determinants of Health (SDOH) Interventions    Readmission Risk Interventions No flowsheet data found.

## 2019-04-16 NOTE — Anesthesia Postprocedure Evaluation (Signed)
Anesthesia Post Note  Patient: Reginald Farmer  Procedure(s) Performed: INCISION AND DRAINAGE NECK ABSCESS (N/A Neck)     Patient location during evaluation: PACU Anesthesia Type: General Level of consciousness: sedated and patient cooperative Pain management: pain level controlled Vital Signs Assessment: post-procedure vital signs reviewed and stable Respiratory status: spontaneous breathing Cardiovascular status: stable Anesthetic complications: no    Last Vitals:  Vitals:   04/15/19 2004 04/16/19 0410  BP: 120/68 100/63  Pulse: 94 66  Resp: 18 17  Temp: 36.9 C 36.7 C  SpO2: 98% 100%    Last Pain:  Vitals:   04/16/19 0530  TempSrc:   PainSc: [name]

## 2019-04-17 LAB — AEROBIC CULTURE? (SUPERFICIAL SPECIMEN)

## 2019-04-17 LAB — CBC
HCT: 37.6 % — ABNORMAL LOW (ref 39.0–52.0)
Hemoglobin: 11.8 g/dL — ABNORMAL LOW (ref 13.0–17.0)
MCH: 28.3 pg (ref 26.0–34.0)
MCHC: 31.4 g/dL (ref 30.0–36.0)
MCV: 90.2 fL (ref 80.0–100.0)
Platelets: 218 10*3/uL (ref 150–400)
RBC: 4.17 MIL/uL — ABNORMAL LOW (ref 4.22–5.81)
RDW: 14.7 % (ref 11.5–15.5)
WBC: 10.8 10*3/uL — ABNORMAL HIGH (ref 4.0–10.5)
nRBC: 0 % (ref 0.0–0.2)

## 2019-04-17 LAB — AEROBIC CULTURE W GRAM STAIN (SUPERFICIAL SPECIMEN)

## 2019-04-17 MED ORDER — NICOTINE 21 MG/24HR TD PT24: 21.0000 mg | MEDICATED_PATCH | Freq: Every day | TRANSDERMAL | 0 refills | Status: AC

## 2019-04-17 MED ORDER — CLINDAMYCIN HCL 150 MG PO CAPS: 450.0000 mg | ORAL_CAPSULE | Freq: Three times a day (TID) | ORAL | 0 refills | Status: AC

## 2019-04-17 MED ORDER — OXYCODONE-ACETAMINOPHEN 5-325 MG PO TABS: 1.0000 | ORAL_TABLET | Freq: Four times a day (QID) | ORAL | 0 refills | Status: DC | PRN

## 2019-04-17 NOTE — Plan of Care (Addendum)
Teached patient how to do step by step wet to dry dressing change  and its purposeto his neck wound abscess while the nurse changing his dressing earlier this morning.Asked patient any questions,patient said ''he understood and his girlfriend would helped him to change his dressing.Emphazised the washing of hands before and after the dressing change.Dresing materials given to patient that will last for a week including sterile water in a 10 cc syringes plus reminded patient that change dressing schedule twice per day.

## 2019-04-17 NOTE — Discharge Summary (Signed)
Physician Discharge Summary  Reginald Farmer HYQ:[id number] DOB: [birth year] DOA: 04/14/2019  PCP: Patient, No Pcp Per  Admit date: 04/14/2019 Discharge date: 04/17/2019  Time spent: 45 minutes  Recommendations for Outpatient Follow-up:  Patient will be discharged to home.  Patient will need to follow up with primary care provider within one week of discharge.  Dressing changes twice daily. Follow up with general surgery. Patient should continue medications as prescribed.  Patient should follow a regular diet.   Discharge Diagnoses:  Principal Problem:   Cellulitis and abscess of neck Active Problems:   Tobacco abuse   GERD (gastroesophageal reflux disease)   Cellulitis of neck   Neck abscess   Discharge Condition: Stable  Diet recommendation: regular  Filed Weights   04/15/19 0614 04/16/19 0410 04/16/19 2000  Weight: 87.8 kg 87.4 kg 87.8 kg    History of present illness:  on 04/15/2019 by Dr. Ivor Costa Farmer L Johnsonis a 28 y.o.malewith medical history significant ofGERD, tobacco abuse, who presents with neck pain and neck abscess.  Pt states he has been having right sided neck pain and neck abscess for several day. The pain isconstant, severe, sharp, nonradiating.He states that heinitallynoticed asmall pimple on the right side of his neck few days ago.It has beengradually increasing in size, and more painfuland swollen.Patient does not have fever or chills. Denies any chest pain, shortness of breath.No nausea vomiting, diarrhea, abdominal pain, symptoms of UTI or unilateral weakness. He denies any throat swelling sensation or difficulty breathing.  Hospital Course:  Cellulitis and abscess of Neck, right -S/p partial drainage by EDP -Continues to have significant edema and pain with increasing leukocytosis -CT soft tissue neck: Symptoms soft tissue swelling with inflammatory stranding throughout the right lateral neck, compatible with acute cellulitis.  2.5 x  1.4 x 3.8 cm ill-defined hypodensity within the subcutaneous fat at the epicenter of this inflammatory process concerning for phlegmon and/or early abscess. -Blood cultures show no growth to date -Wound culture: Abundant GPC in pairs  -ESR 7 (WNL) and CRP 4 (elevated) -was placed on vancomycin and zosyn -General surgery consulted and appreciated, s/p Sharp incision and debridement of right neck abscess (skin and subcutaneous tissues 2 x 2 x 1.5 cm) -Continue pain control -Will discharge patient with clindamycin and short course of pain medications  Leukocytosis -likely secondary to the above -improved today, down to 10.8  Tobacco abuse -Continue nicotine patch  GERD -Continue PPI   COVID test negative.  Patient currently asymptomatic.  PPE worn: Gown, gloves, face shield, surgical mask  Procedures Sharp incision and debridement of right neck abscess (skin and subcutaneous tissues 2 x 2 x 1.5 cm)  Consultants General surgery  Discharge Exam: Vitals:   04/17/19 0416 04/17/19 0909  BP: (!) 103/45 140/76  Pulse: 62 75  Resp: 17 18  Temp: 98 F (36.7 C) 98.2 F (36.8 C)  SpO2: 99% 99%     General: Well developed, well nourished, NAD, appears stated age  HEENT: NCAT, mucous membranes moist.  Neck: right neck with dressing place, improved edema, induration and erythema   Cardiovascular: S1 S2 auscultated, no rubs, murmurs or gallops. Regular rate and rhythm.  Respiratory: Clear to auscultation bilaterally with equal chest rise  Abdomen: Soft, nontender, nondistended, + bowel sounds  Extremities: warm dry without cyanosis clubbing or edema  Neuro: AAOx3, nonfocal  Psych: Normal affect and demeanor  Discharge Instructions Discharge Instructions    Discharge instructions   Complete by: As directed    Patient will  be discharged to home.  Patient will need to follow up with primary care provider within one week of discharge.  Dressing changes twice daily. Follow  up with general surgery. Patient should continue medications as prescribed.  Patient should follow a regular diet.     Allergies as of 04/17/2019   No Known Allergies     Medication List    TAKE these medications   acetaminophen 325 MG tablet Commonly known as: TYLENOL Take 650 mg by mouth every 6 (six) hours as needed for mild pain.   clindamycin 150 MG capsule Commonly known as: CLEOCIN Take 3 capsules (450 mg total) by mouth 3 (three) times daily for 7 days.   nicotine 21 mg/24hr patch Commonly known as: NICODERM CQ - dosed in mg/24 hours Place 1 patch (21 mg total) onto the skin daily.   oxyCODONE-acetaminophen 5-325 MG tablet Commonly known as: PERCOCET/ROXICET Take 1 tablet by mouth every 6 (six) hours as needed for moderate pain.      No Known Allergies Follow-up [facility], [place]. Go on 04/29/2019.   Specialty: General Surgery Why: 06/25 at 3:45 pm. Please arrive 30 minutes prior to complete paperwork. Please bring photo ID and insurance card Contact information: [address] [place] [phone]           The results of significant diagnostics from this hospitalization (including imaging, microbiology, ancillary and laboratory) are listed below for reference.    Significant Diagnostic Studies: Ct Soft Tissue Neck W Contrast  Result Date: 04/15/2019 CLINICAL DATA:  Initial evaluation for right-sided neck swelling. EXAM: CT NECK WITH CONTRAST TECHNIQUE: Multidetector CT imaging of the neck was performed using the standard protocol following the bolus administration of intravenous contrast. CONTRAST:  84m OMNIPAQUE IOHEXOL 300 MG/ML  SOLN COMPARISON:  None. FINDINGS: Pharynx and larynx: Examination limited by motion artifact and patient positioning. Oral cavity within normal limits. No acute inflammatory changes about the dentition. Nasopharynx and oropharynx grossly within normal limits,  although evaluation limited by motion. No discernible retropharyngeal collection. Epiglottis grossly normal. Remainder of the hypopharynx and supraglottic larynx grossly within normal limits. True cords symmetric and normal. Subglottic airway clear. Salivary glands: Inflammatory changes surround the right parotid gland related to the adjacent inflammatory process within the right neck. Mild extension into the right submandibular space, partially surrounding the right submandibular gland as well. Salivary glands otherwise unremarkable. Thyroid: Negative. Lymph nodes: 12 mm right level II node, likely reactive. No other pathologically enlarged lymph nodes seen within the neck. Vascular: Normal intravascular enhancement seen throughout the neck. Limited intracranial: Unremarkable. Visualized orbits: Visualized globes and orbital soft tissues within normal limits. Mastoids and visualized paranasal sinuses: Visualized paranasal sinuses are clear. Mastoid air cells and middle ear cavities are well pneumatized and free of fluid. Skeleton: No acute osseous finding. No discrete lytic or blastic osseous lesions. Upper chest: Visualized upper chest demonstrates no acute finding. Partially visualized lungs are clear. Other: Extensive soft tissue swelling with inflammatory stranding seen throughout the right lateral neck, compatible with acute cellulitis. Overlying skin thickening. Inflammatory changes partially surround the right sternocleidomastoid muscle. Extension into the right parotid and submandibular spaces as well as the submental region, then inferiorly along the anterior aspect of the anterior neck towards the upper chest. Partial extension into the right parapharyngeal space as well. There is an ill-defined area of hypodensity involving the subcutaneous fat at the epicenter of this inflammatory process within the right neck  measuring approximately 2.5 x 1.4 x 3.8 cm (AP by transverse by craniocaudad, concerning for  phlegmon and/or early abscess. This is at the site of maximal soft tissue/skin swelling. IMPRESSION: 1. Extensive soft tissue swelling with inflammatory stranding throughout the right lateral neck, compatible with acute cellulitis. Superimposed 2.5 x 1.4 x 3.8 cm ill-defined hypodensity within the subcutaneous fat at the epicenter of this inflammatory process, concerning for phlegmon and/or early abscess. 2. Mildly enlarged right level II lymph node, likely reactive. Electronically Signed   By: Jeannine Boga M.D.   On: 04/15/2019 03:29    Microbiology: Recent Results (from the past 240 hour(s))  Novel Coronavirus,NAA,(SEND-OUT TO REF LAB - TAT 24-48 hrs); Hosp Order     Status: None   Collection Time: 04/15/19  3:55 AM   Specimen: Abscess; Respiratory  Result Value Ref Range Status   SARS-CoV-2, NAA NOT DETECTED NOT DETECTED Final    Comment: (NOTE) This test was developed and its performance characteristics determined by [employer]. This test has not been FDA cleared or approved. This test has been authorized by FDA under an Emergency Use Authorization (EUA). This test is only authorized for the duration of time the declaration that circumstances exist justifying the authorization of the emergency use of in vitro diagnostic tests for detection of SARS-CoV-2 virus and/or diagnosis of COVID-19 infection under section 564(b)(1) of the Act, 21 U.S.C. 599JTT-0(V)(7), unless the authorization is terminated or revoked sooner. When diagnostic testing is negative, the possibility of a false negative result should be considered in the context of a patient's recent exposures and the presence of clinical signs and symptoms consistent with COVID-19. An individual without symptoms of COVID-19 and who is not shedding SARS-CoV-2 virus would expect to have a negative (not detected) result in this assay. Performed  At: [facility] [address] [place], [place] [id number]  Rush Farmer MD QZ:[id number]    [place]  Final    Comment: Performed at [facility], [place] [address], [place], [place] [address]  Wound or Superficial Culture     Status: None (Preliminary result)   Collection Time: 04/15/19  4:23 AM   Specimen: Abscess; Wound  Result Value Ref Range Status   Specimen Description ABSCESS NECK  Final   Special Requests NONE  Final   Gram Stain   Final    MODERATE WBC PRESENT, PREDOMINANTLY PMN ABUNDANT GRAM POSITIVE COCCI IN PAIRS Performed at [facility], [address], [place], [place] [address]    Culture ABUNDANT STAPHYLOCOCCUS AUREUS  Final   Report Status PENDING  Incomplete  Culture, blood (x 2)     Status: None (Preliminary result)   Collection Time: 04/15/19  4:45 AM   Specimen: BLOOD  Result Value Ref Range Status   Specimen Description BLOOD RIGHT ARM  Final   Special Requests   Final    BOTTLES DRAWN AEROBIC ONLY Blood Culture adequate volume   Culture   Final    NO GROWTH 2 DAYS Performed at [facility], [place] [address], [place], [place] [address]    Report Status PENDING  Incomplete  Culture, blood (x 2)     Status: None (Preliminary result)   Collection Time: 04/15/19  5:02 AM   Specimen: BLOOD LEFT HAND  Result Value Ref Range Status   Specimen Description BLOOD LEFT HAND  Final   Special Requests   Final    BOTTLES DRAWN AEROBIC AND ANAEROBIC Blood Culture results may not  be optimal due to an excessive volume of blood received in culture bottles   Culture   Final    NO GROWTH 2 DAYS Performed at [facility], [place] [address] [place], [place] [address]    Report Status PENDING  Incomplete  MRSA PCR Screening     Status: None   Collection Time: 04/15/19 12:27 PM   Specimen: Nasal Mucosa; Nasopharyngeal  Result Value Ref Range Status   MRSA by PCR NEGATIVE NEGATIVE Final    Comment:        The GeneXpert MRSA Assay (FDA approved for NASAL specimens only), is one  component of a comprehensive MRSA colonization surveillance program. It is not intended to diagnose MRSA infection nor to guide or monitor treatment for MRSA infections. Performed at [facility], [place] [address], [place], [place] [address]   SARS Coronavirus 2     Status: None   Collection Time: 04/15/19 12:29 PM  Result Value Ref Range Status   SARS Coronavirus 2 NOT DETECTED NOT DETECTED Final    Comment: (NOTE) SARS-CoV-2 target nucleic acids are NOT DETECTED. The SARS-CoV-2 RNA is generally detectable in upper and lower respiratory specimens during the acute phase of infection.  Negative  results do not preclude SARS-CoV-2 infection, do not rule out co-infections with other pathogens, and should not be used as the sole basis for treatment or other patient management decisions.  Negative results must be combined with clinical observations, patient history, and epidemiological information. The expected result is Not Detected. Fact Sheet for Patients: http://www.biofiredefense.com/wp-content/uploads/2020/03/BIOFIRE-COVID -19-patients.pdf Fact Sheet for Healthcare Providers: http://www.biofiredefense.com/wp-content/uploads/2020/03/BIOFIRE-COVID -19-hcp.pdf This test is not yet approved or cleared by the [place] and  has been authorized for detection and/or diagnosis of SARS-CoV-2 by FDA under an Emergency Use Authorization (EUA).  This EUA will remain in effec t (meaning this test can be used) for the duration of  the COVID-19 declaration under Section 564(b)(1) of the Act, 21 U.S.C. section 360bbb-3(b)(1), unless the authorization is terminated or revoked sooner. Performed at [facility], [place] [address] [place], [place] [address]      Labs: Basic Metabolic Panel: Recent Labs  Lab 04/15/19 0058 04/15/19 0520 04/16/19 0502  NA 136 136 140  K 4.1 3.3* 3.7  CL 104 103 105  CO2 _0 GLUCOSE 97 100* 105*  BUN _1 CREATININE 1.02  0.99 0.99  CALCIUM 8.9 8.8* 8.7*   Liver Function Tests: No results for input(s): AST, ALT, ALKPHOS, BILITOT, PROT, ALBUMIN in the last 168 hours. No results for input(s): LIPASE, AMYLASE in the last 168 hours. No results for input(s): AMMONIA in the last 168 hours. CBC: Recent Labs  Lab 04/15/19 0058 04/15/19 0520 04/16/19 0502 04/17/19 0730  WBC 12.9* 14.4* 17.1* 10.8*  NEUTROABS 8.8*  --   --   --   HGB 13.8 13.7 13.9 11.8*  HCT 42.8 42.3 43.9 37.6*  MCV 88.2 88.3 89.6 90.2  PLT 237 241 239 218   Cardiac Enzymes: No results for input(s): CKTOTAL, CKMB, CKMBINDEX, TROPONINI in the last 168 hours. BNP: BNP (last 3 results) No results for input(s): BNP in the last 8760 hours.  ProBNP (last 3 results) No results for input(s): PROBNP in the last 8760 hours.  CBG: No results for input(s): GLUCAP in the last 168 hours.     Signed:  Cristal Ford  [facility] 04/17/2019, 10:22 AM

## 2019-04-17 NOTE — Progress Notes (Signed)
DISCHARGE NOTE HOME Reginald Farmer to be discharged to home per MD order. Discussed prescriptions and follow up appointments with the patient. Prescriptions given to patient; medication list explained in detail. Patient verbalized understanding.  Skin clean, dry and intact without evidence of skin break down, no evidence of skin tears noted. IV catheter discontinued intact. Site without signs and symptoms of complications. Dressing and pressure applied. Pt denies pain at the site currently. No complaints noted.  Patient free of lines, drains, and wounds.   An After Visit Summary (AVS) was printed and given to the patient. Patient escorted via wheelchair, and discharged home via private auto.  [place], Zenon Mayo, RN

## 2019-04-20 LAB — CULTURE, BLOOD (ROUTINE X 2)
Culture: NO GROWTH
Culture: NO GROWTH
Special Requests: ADEQUATE

## 2019-04-23 ENCOUNTER — Inpatient Hospital Stay: Payer: Self-pay | Admitting: Primary Care

## 2019-04-28 ENCOUNTER — Telehealth: Payer: Self-pay | Admitting: Family Medicine

## 2019-05-22 ENCOUNTER — Other Ambulatory Visit: Payer: Self-pay

## 2019-05-22 ENCOUNTER — Ambulatory Visit (HOSPITAL_COMMUNITY)
Admission: EM | Admit: 2019-05-22 | Discharge: 2019-05-22 | Disposition: A | Discharge status: Home / Self Care | Payer: Self-pay | Attending: Family Medicine | Admitting: Family Medicine

## 2019-05-22 ENCOUNTER — Encounter (HOSPITAL_COMMUNITY): Payer: Self-pay | Admitting: Emergency Medicine

## 2019-05-22 DIAGNOSIS — K644 Residual hemorrhoidal skin tags: Secondary | ICD-10-CM

## 2019-05-22 MED ORDER — CEFTRIAXONE SODIUM 250 MG IJ SOLR
INTRAMUSCULAR | Status: AC
  Filled 2019-05-22: qty 250

## 2019-05-22 MED ORDER — AZITHROMYCIN 250 MG PO TABS
ORAL_TABLET | ORAL | Status: AC
  Filled 2019-05-22: qty 4

## 2019-05-22 NOTE — ED Provider Notes (Signed)
[facility]    CSN: [id number] Arrival date & time: 05/22/19  1020     History   Chief Complaint Chief Complaint  Patient presents with  . Rectal Pain    HPI Reginald Farmer is a 28 y.o. male presenting for acute concern of sharp, rectal pain and swelling.  Patient states onset was yesterday after bowel movement that was without blood or melena.  Patient states he typically has 1-2 bowel movements daily, has not had to defecate today.  Patient denies history of hemorrhoids, trauma to the area, anal intercourse.  Patient has not tried anything for his pain.   Past Medical History:  Diagnosis Date  . Abscess 04/15/2019   right side of neck  . GERD (gastroesophageal reflux disease)   . Tobacco abuse     Patient Active Problem List   Diagnosis Date Noted  . Cellulitis and abscess of neck 04/15/2019  . Neck abscess 04/15/2019  . Tobacco abuse   . GERD (gastroesophageal reflux disease)   . Cellulitis of neck     Past Surgical History:  Procedure Laterality Date  . INCISION AND DRAINAGE ABSCESS N/A 04/15/2019   Procedure: INCISION AND DRAINAGE NECK ABSCESS;  Surgeon: Manus Ruddsuei, Matthew, MD;  Location: [facility] OR;  Service: General;  Laterality: N/A;  . NO PAST SURGERIES         Home Medications    Prior to Admission medications   Medication Sig Start Date End Date Taking? Authorizing Provider  acetaminophen (TYLENOL) 325 MG tablet Take 650 mg by mouth every 6 (six) hours as needed for mild pain.    [provider]  nicotine (NICODERM CQ - DOSED IN MG/24 HOURS) 21 mg/24hr patch Place 1 patch (21 mg total) onto the skin daily. 04/17/19   Edsel PetrinMikhail, Maryann, DO    Family History Family History  Problem Relation Age of Onset  . Diabetes Mellitus II Maternal Grandmother     Social History Social History   Tobacco Use  . Smoking status: Current Every Day Smoker    Types: Cigarettes  . Smokeless tobacco: Never Used  Substance Use Topics  . Alcohol use:  No  . Drug use: Yes    Types: Marijuana     Allergies   Patient has no known allergies.   Review of Systems Review of Systems  Constitutional: Negative for fatigue and fever.  Respiratory: Negative for shortness of breath and wheezing.   Cardiovascular: Negative for chest pain and palpitations.  Gastrointestinal: Positive for rectal pain. Negative for abdominal distention, abdominal pain, anal bleeding, blood in stool, constipation, diarrhea, nausea and vomiting.  Genitourinary: Negative for difficulty urinating, hematuria, penile pain, penile swelling, scrotal swelling and testicular pain.     Physical Exam Triage Vital Signs ED Triage Vitals [05/22/19 1059]  Enc Vitals Group     BP 127/83     Pulse Rate (!) 58     Resp 16     Temp (!) 97.4 F (36.3 C)     Temp src      SpO2 97 %     Weight      Height      Head Circumference      Peak Flow      Pain Score 8     Pain Loc      Pain Edu?      Excl. in GC?    No data found.  Updated Vital Signs BP 127/83   Pulse (!) 58   Temp (!)  97.4 F (36.3 C)   Resp 16   SpO2 97%    Physical Exam Constitutional:      General: He is not in acute distress. HENT:     Head: Normocephalic and atraumatic.  Eyes:     General: No scleral icterus.    Pupils: Pupils are equal, round, and reactive to light.  Cardiovascular:     Rate and Rhythm: Normal rate.  Pulmonary:     Effort: Pulmonary effort is normal.  Genitourinary:    Comments: Bulging tissue that is circumferential around anus consistent with external hemorrhoid, though possible distal rectal prolapse.  No mucosal tissue identified.  Manually reduced successfully by Dr. Mannie Stabile which patient tolerated well. Skin:    Coloration: Skin is not jaundiced or pale.  Neurological:     Mental Status: He is alert and oriented to person, place, and time.      UC Treatments / Results  Labs (all labs ordered are listed, but only abnormal results are displayed) Labs  Reviewed - No data to display  EKG   Radiology No results found.  Procedures Procedures (including critical care time)  Medications Ordered in UC Medications  azithromycin (ZITHROMAX) 250 MG tablet (has no administration in time range)  cefTRIAXone (ROCEPHIN) 250 MG injection (has no administration in time range)    Initial Impression / Assessment and Plan / UC Course  I have reviewed the triage vital signs and the nursing notes.  Pertinent labs & imaging results that were available during my care of the patient were reviewed by me and considered in my medical decision making (see chart for details).     1.  External hemorrhoid With mild rectal prolapse.  Manually reproduced by Dr. Valma Cava in office which patient tolerated well.  Will treat conservatively, and have patient follow-up with surgery for further management/evaluation.  Return precautions discussed, patient verbalized understanding and is agreeable to plan. Final Clinical Impressions(s) / UC Diagnoses   Final diagnoses:  External hemorrhoids     Discharge Instructions     May use sits baths for additional pain relief: Poor 3 to 4 inches of warm water in tub and sit for 10 to 20 minutes. Follow-up with surgery for further evaluation/management.    ED Prescriptions    None     Controlled Substance Prescriptions [place] Controlled Substance Registry consulted? Not Applicable   Quincy Sheehan, [place] 05/23/19 1710

## 2019-05-22 NOTE — Discharge Instructions (Signed)
May use sits baths for additional pain relief: Poor 3 to 4 inches of warm water in tub and sit for 10 to 20 minutes. Follow-up with surgery for further evaluation/management.

## 2019-05-22 NOTE — ED Triage Notes (Signed)
Pt states "my bootyhole is swollen". Pt states its painful. Has not tried having a bowel movement since the pain started yesterday.

## 2019-05-25 ENCOUNTER — Encounter (HOSPITAL_COMMUNITY): Payer: Self-pay

## 2019-05-25 ENCOUNTER — Emergency Department (HOSPITAL_COMMUNITY)
Admission: EM | Admit: 2019-05-25 | Discharge: 2019-05-25 | Disposition: A | Discharge status: Home / Self Care | Payer: Self-pay | Attending: Emergency Medicine | Admitting: Emergency Medicine

## 2019-05-25 ENCOUNTER — Other Ambulatory Visit: Payer: Self-pay

## 2019-05-25 DIAGNOSIS — K6289 Other specified diseases of anus and rectum: Secondary | ICD-10-CM

## 2019-05-25 DIAGNOSIS — K622 Anal prolapse: Secondary | ICD-10-CM | POA: Insufficient documentation

## 2019-05-25 DIAGNOSIS — F1290 Cannabis use, unspecified, uncomplicated: Secondary | ICD-10-CM | POA: Insufficient documentation

## 2019-05-25 DIAGNOSIS — F17210 Nicotine dependence, cigarettes, uncomplicated: Secondary | ICD-10-CM | POA: Insufficient documentation

## 2019-05-25 MED ORDER — HYDROMORPHONE HCL 1 MG/ML IJ SOLN: 1.0000 mg | INTRAMUSCULAR | Status: DC | PRN

## 2019-05-25 MED ORDER — NAPROXEN 500 MG PO TABS: 500.0000 mg | ORAL_TABLET | Freq: Two times a day (BID) | ORAL | 0 refills | Status: AC

## 2019-05-25 MED ORDER — ACETAMINOPHEN 500 MG PO TABS
1000.0000 mg | ORAL_TABLET | Freq: Once | ORAL | Status: AC
  Administered 2019-05-25: 1000 mg via ORAL
  Filled 2019-05-25: qty 2

## 2019-05-25 MED ORDER — OXYCODONE-ACETAMINOPHEN 5-325 MG PO TABS
1.0000 | ORAL_TABLET | Freq: Once | ORAL | Status: AC
  Administered 2019-05-25: 1 via ORAL
  Filled 2019-05-25: qty 1

## 2019-05-25 NOTE — ED Triage Notes (Signed)
Patient states, "I have a boil on my rectum and it has been there x 2 days." Patient states  Pain worse after having a BM this AM.

## 2019-05-25 NOTE — Discharge Instructions (Addendum)
You have a anal/rectal prolapse. This will likely reoccur. You need to call general surgery ASAP because you may need surgical intervention. I am unable to send a referral from here, so you need to call.  Drink at least 2 L of water every day.  Use miralax twice a day (cap full diluted in water) and diet rich in fiber. Avoid foods that will make you constipated. You may self reduce if small prolapse reoccurs at home as we discussed today (sugar, ice, firm pressure with lubricant)  For pain and inflammation you can use a combination of ibuprofen and acetaminophen.  Take [phone] mg acetaminophen (tylenol) every 6 hours or 500 mg naproxen every 6 hours.  You can take these separately or combine them every 6 hours for maximum pain control. Do not exceed 4,000 mg acetaminophen or 1,000 mg naproxen in a 24 hour period.  Do not take naproxen containing products if you have history of kidney disease, ulcers, GI bleeding, severe acid reflux, or take a blood thinner.  Do not take acetaminophen if you have liver disease.

## 2019-05-25 NOTE — ED Provider Notes (Signed)
[facility] DEPT Provider Note   CSN: [id number] Arrival date & time: 05/25/19  0911    History   Chief Complaint Chief Complaint  Patient presents with  . Rectal Pain    HPI Reginald Farmer is a 28 y.o. male presents to the ER for evaluation of "boil" in his anal area.  Onset 2 to 3 days ago.  Associated with severe, constant throbbing pain and bleeding.  He went to urgent care on 7/19 and states they told him to do sitz bath's which she has been doing.  Per urgent care note there was concern for hemorrhoid versus prolapse.  Provider there documented reduction however patient states that the swelling was still there and he had persistent pain which has worsened over the last few days.  He had a bowel movement today and as soon as he passed the stool he had sudden onset of worsening pain and when he wiped there was streaks of blood.  The bulging now feels bigger.  No other interventions.  No alleviating factors.  He denies any history of constipation states he typically has a BM 1-2 times daily, for the last 2 to 3 days he has not had a BM due to the anal pain.  He typically does not have to strain with bowel movements.  Denies any perianal/rectal trauma, intercourse, drainage, abdominal pain, melena, blood in the stool, fevers, chills.     HPI  Past Medical History:  Diagnosis Date  . Abscess 04/15/2019   right side of neck  . GERD (gastroesophageal reflux disease)   . Tobacco abuse     Patient Active Problem List   Diagnosis Date Noted  . Cellulitis and abscess of neck 04/15/2019  . Neck abscess 04/15/2019  . Tobacco abuse   . GERD (gastroesophageal reflux disease)   . Cellulitis of neck     Past Surgical History:  Procedure Laterality Date  . INCISION AND DRAINAGE ABSCESS N/A 04/15/2019   Procedure: INCISION AND DRAINAGE NECK ABSCESS;  Surgeon: Donnie Mesa, MD;  Location: [facility];  Service: General;  Laterality: N/A;  . NO PAST SURGERIES           Home Medications    Prior to Admission medications   Medication Sig Start Date End Date Taking? Authorizing Provider  acetaminophen (TYLENOL) 325 MG tablet Take 650 mg by mouth every 6 (six) hours as needed for mild pain.   Yes [provider]  naproxen (NAPROSYN) 500 MG tablet Take 1 tablet (500 mg total) by mouth 2 (two) times daily. 05/25/19   Kinnie Feil, PA-C  nicotine (NICODERM CQ - DOSED IN MG/24 HOURS) 21 mg/24hr patch Place 1 patch (21 mg total) onto the skin daily. Patient not taking: Reported on 05/25/2019 04/17/19   Cristal Ford, DO    Family History Family History  Problem Relation Age of Onset  . Diabetes Mellitus II Maternal Grandmother     Social History Social History   Tobacco Use  . Smoking status: Current Every Day Smoker    Packs/day: 0.25    Types: Cigarettes  . Smokeless tobacco: Never Used  Substance Use Topics  . Alcohol use: No  . Drug use: Yes    Types: Marijuana     Allergies   Patient has no known allergies.   Review of Systems Review of Systems  Genitourinary:       Rectal pain, swelling and bleeding   All other systems reviewed and are negative.  Physical Exam Updated Vital Signs BP 118/68   Pulse 80   Temp 98.4 F (36.9 C) (Oral)   Resp 16   Ht 5\' 6"  (1.676 m)   Wt 88.5 kg   SpO2 100%   BMI 31.47 kg/m   Physical Exam Vitals signs and nursing note reviewed.  Constitutional:      General: He is not in acute distress.    Appearance: He is well-developed.     Comments: NAD.  HENT:     Head: Normocephalic and atraumatic.     Right Ear: External ear normal.     Left Ear: External ear normal.     Nose: Nose normal.  Eyes:     Conjunctiva/sclera: Conjunctivae normal.  Neck:     Musculoskeletal: Normal range of motion and neck supple.  Cardiovascular:     Rate and Rhythm: Normal rate and regular rhythm.     Heart sounds: Normal heart sounds.  Pulmonary:     Effort: Pulmonary effort is  normal.     Breath sounds: Normal breath sounds.  Abdominal:     General: Abdomen is flat.     Palpations: Abdomen is soft.     Tenderness: There is no abdominal tenderness.  Genitourinary:    Comments:  RN chaperone at bedside during exam  Small anal/rectal mucosa prolapse, circumferential with diameter approximately 5 x 3 cm, tiny area of abrasion at 9 o'clock, not bleeding. This is likely hemorrhoids given size an circumferential distribution. No surrounding erythema, drainage, fluctuance. No signs of perianal abscess. Surrounding skin otherwise normal.  Musculoskeletal: Normal range of motion.  Skin:    General: Skin is warm and dry.     Capillary Refill: Capillary refill takes less than 2 seconds.  Neurological:     Mental Status: He is alert and oriented to person, place, and time.  Psychiatric:        Behavior: Behavior normal.        Thought Content: Thought content normal.        Judgment: Judgment normal.      ED Treatments / Results  Labs (all labs ordered are listed, but only abnormal results are displayed) Labs Reviewed - No data to display  EKG None  Radiology No results found.  Procedures Procedures (including critical care time)  Anal prolapse reduction in ER without immediate complications   Medications Ordered in ED Medications  HYDROmorphone (DILAUDID) injection 1 mg (has no administration in time range)  oxyCODONE-acetaminophen (PERCOCET/ROXICET) 5-325 MG per tablet 1 tablet (1 tablet Oral Given 05/25/19 0950)  acetaminophen (TYLENOL) tablet 1,000 mg (1,000 mg Oral Given 05/25/19 0950)     Initial Impression / Assessment and Plan / ED Course  I have reviewed the triage vital signs and the nursing notes.  Pertinent labs & imaging results that were available during my care of the patient were reviewed by me and considered in my medical decision making (see chart for details).  History and exam is consistent with anal/rectal prolapse.  Mucosa has a  tiny abrasion or at around 9:00 but no signs of ischemia, superimposed infection, significant bleeding.  He denies any significant BM changes, constipation, history of constipation.  He denies any anal trauma.  Initial prolapse reduction in the ER.  Sugar night supplies for approximately 20 to 30 minutes.  Lubricated glove and firm pressure applied with approximately 70% of improvement in prolapse.  Buttocks taped together and patient ambulated with out recurrence.  He was offered IV/IM pain medication but  he declined this because of fear of needles.  He did tolerate procedure well.  No immediate complications.  He reported moderate improvement in his symptoms and pain.  Given benign symptomatology and exam I do not think further emergent work-up or imaging is indicated today.  Encouraged high-fiber diet, hydration, avoidance of straining and close follow-up with general surgery.  I am not able to do an ambulatory referral but he understands importance of following up with [facility] as prolapse may reoccur.  Final Clinical Impressions(s) / ED Diagnoses   Final diagnoses:  Rectal pain  Anal prolapse    ED Discharge Orders         Ordered    naproxen (NAPROSYN) 500 MG tablet  2 times daily     05/25/19 1115    Ambulatory referral to General Surgery  Status:  Canceled    Comments: Please follow up closely with this patient with recurrent anal prolapse   05/25/19 1115           Jerrell MylarGibbons, Pranavi Aure J, PA-C 05/25/19 1125    Linwood DibblesKnapp, Jon, MD 05/28/19 [phone]

## 2019-08-02 IMAGING — CT CT NECK WITH CONTRAST
5 of 6 series · 14 of 33 positions shown, 16 images · IV contrast (APPLIED)
Comparison: None.

CLINICAL DATA: Initial evaluation for right-sided neck swelling.

EXAM:
CT NECK WITH CONTRAST
TECHNIQUE: Multidetector CT imaging of the neck was performed using the
standard protocol following the bolus administration of intravenous
contrast.
CONTRAST:  75mL OMNIPAQUE IOHEXOL 300 MG/ML  SOLN

[Series 3: axial neck · axial · 0.47mm/px · z∈[-194,-118]mm · 2 of 115 slices shown, 3 images]
[im 39/115  soft-tissue]
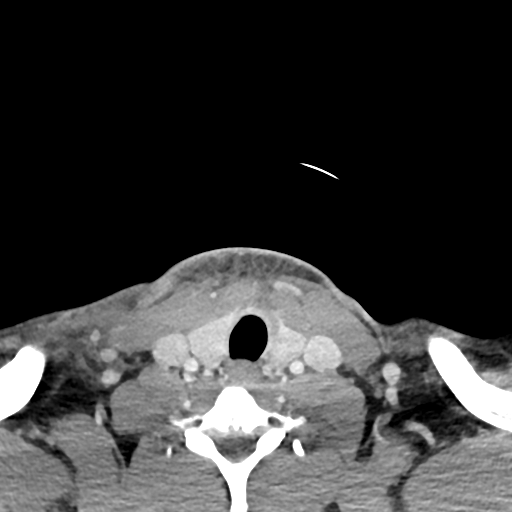
[im 39/115  bone]
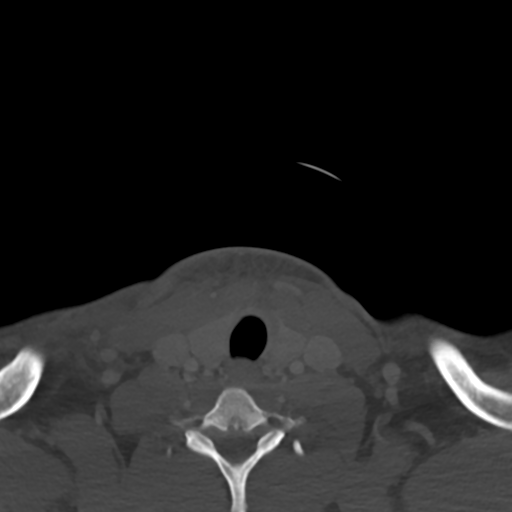
[im 77/115  bone]
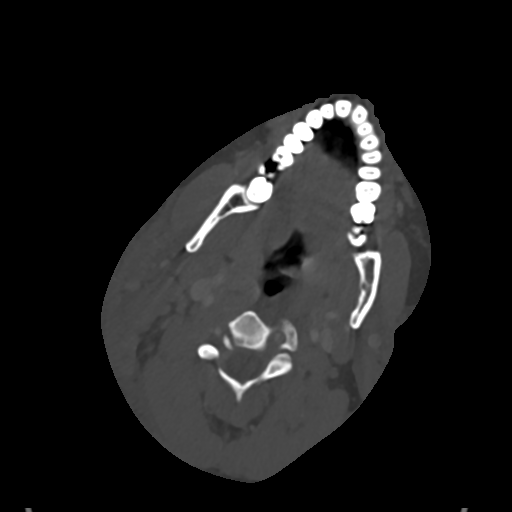

[Series 5: axial bone · axial · 0.47mm/px · z∈[-194,-118]mm · 2 of 115 slices shown]
[im 39/115  bone]
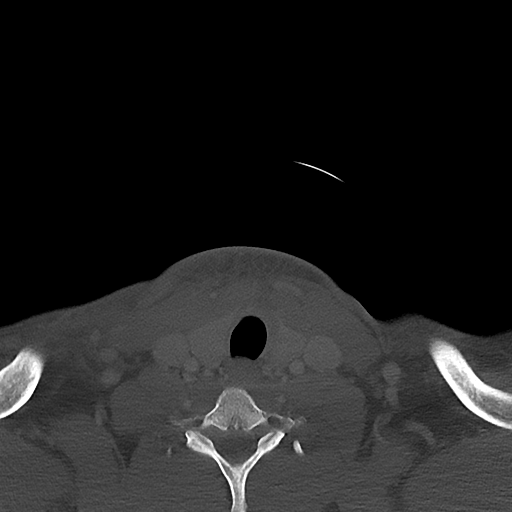
[im 77/115  bone]
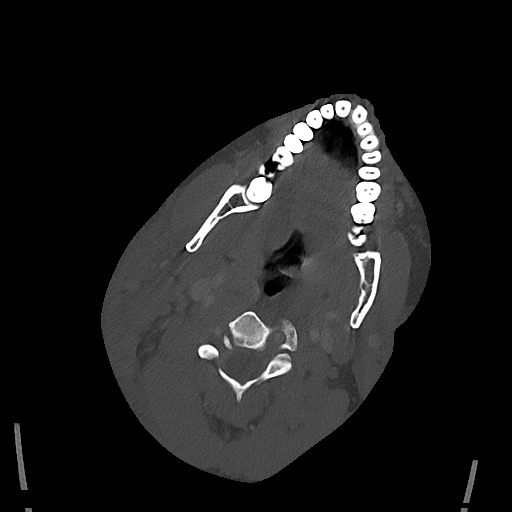

[Series 6: sag neck · sagittal · 0.45mm/px · 5 of 85 slices shown, 6 images]
[im 29/85  bone]
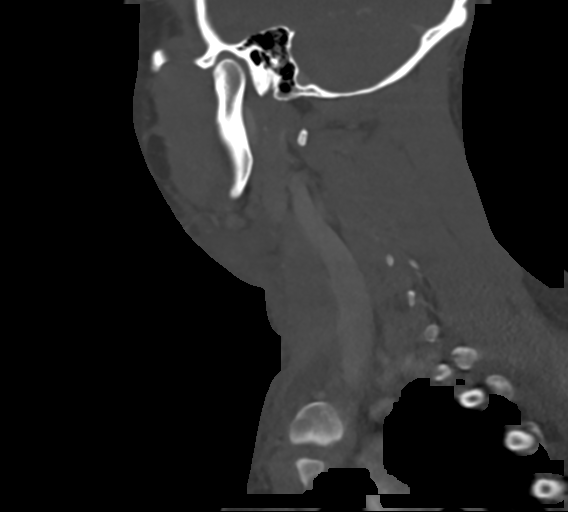
[im 36/85  bone]
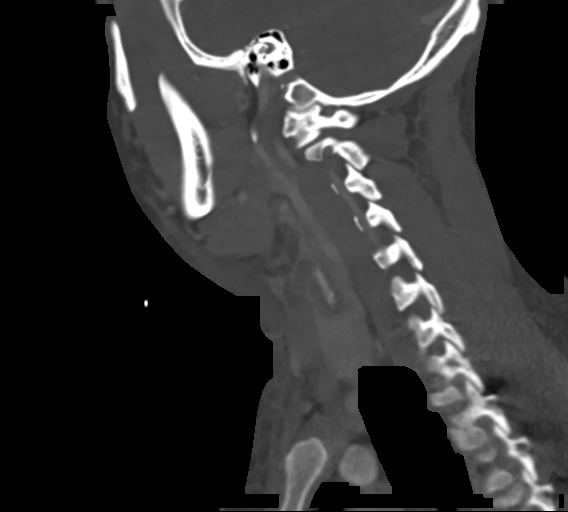
[im 43/85  soft-tissue]
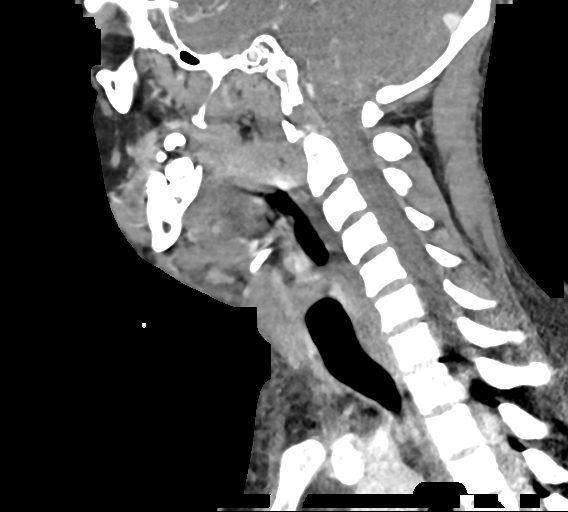
[im 43/85  bone]
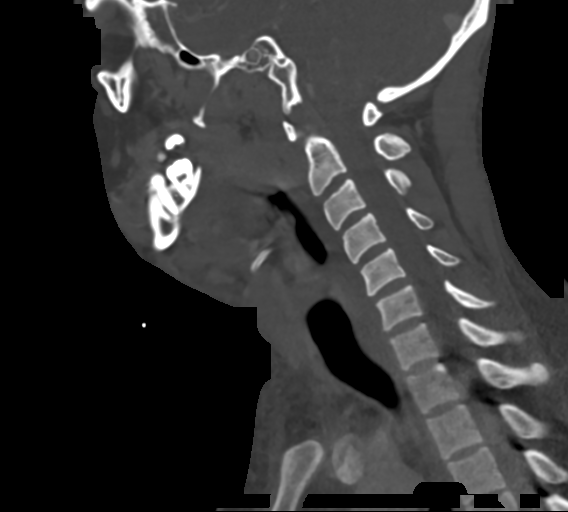
[im 50/85  bone]
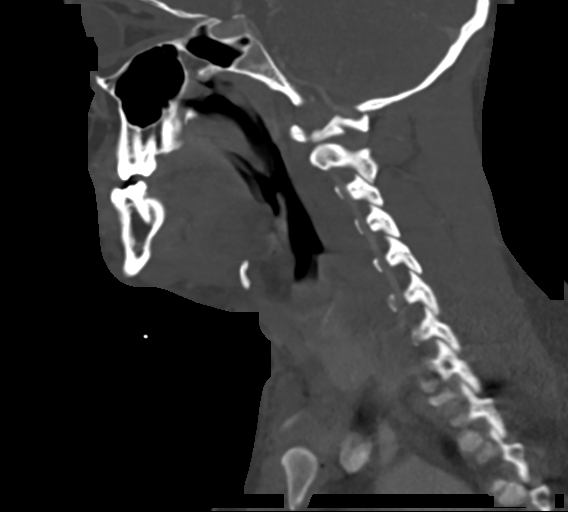
[im 57/85  bone]
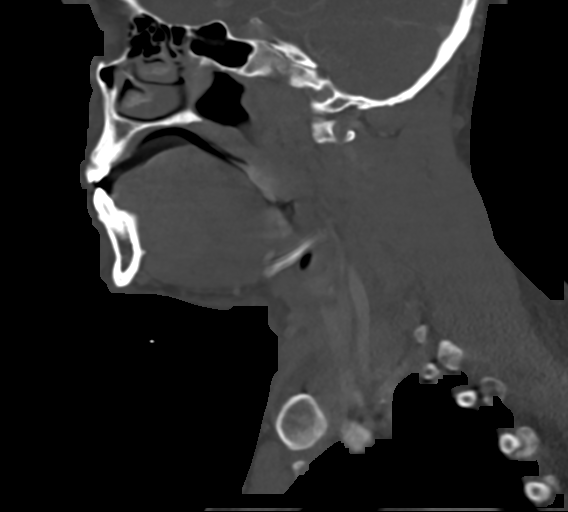

[Series 7: cor neck · coronal · 0.45mm/px · 3 of 122 slices shown]
[im 25/122  bone]
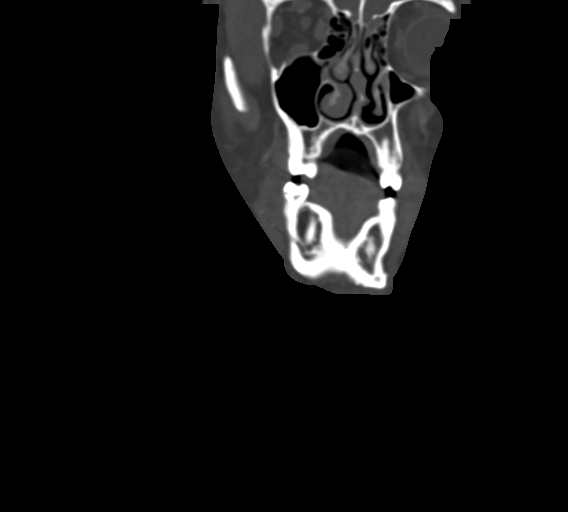
[im 49/122  bone]
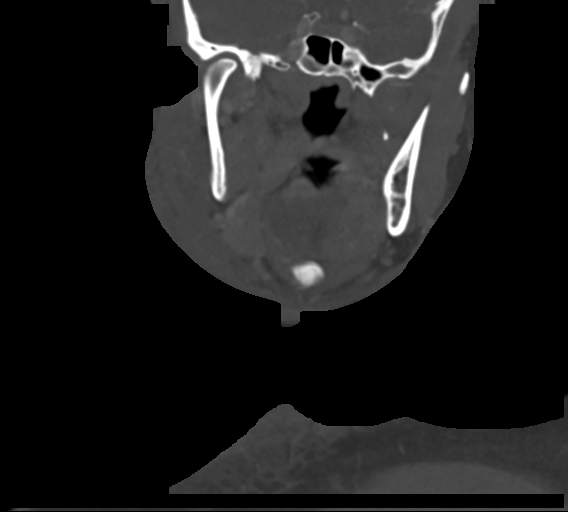
[im 73/122  bone]
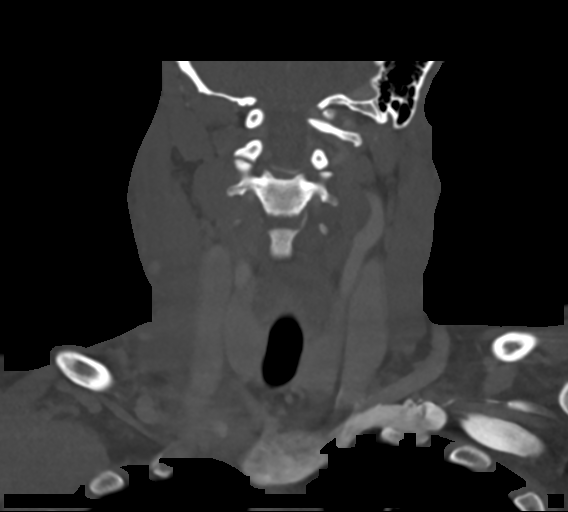

[Series 8: ax oropharynx · axial · 0.39mm/px · z∈[-193,-117]mm · 2 of 116 slices shown]
[im 39/116  bone]
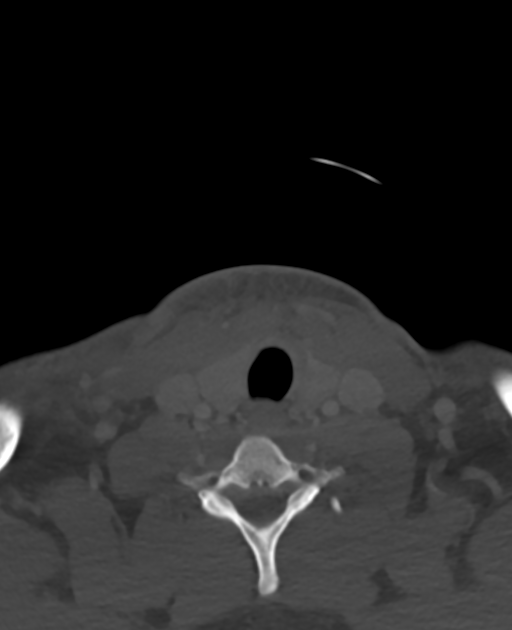
[im 77/116  bone]
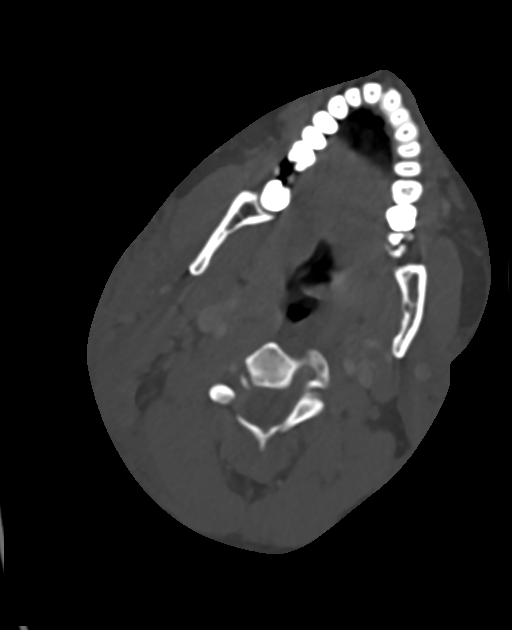

[14 of 33 positions shown; findings below may reference images not displayed]

FINDINGS: Pharynx and larynx: Examination limited by motion artifact and
patient positioning.

Oral cavity within normal limits. No acute inflammatory changes
about the dentition. Nasopharynx and oropharynx grossly within
normal limits, although evaluation limited by motion. No discernible
retropharyngeal collection. Epiglottis grossly normal. Remainder of
the hypopharynx and supraglottic larynx grossly within normal
limits. True cords symmetric and normal. Subglottic airway clear.

Salivary glands: Inflammatory changes surround the right parotid
gland related to the adjacent inflammatory process within the right
neck. Mild extension into the right submandibular space, partially
surrounding the right submandibular gland as well. Salivary glands
otherwise unremarkable.

Thyroid: Negative.

Lymph nodes: 12 mm right level II node, likely reactive. No other
pathologically enlarged lymph nodes seen within the neck.

Vascular: Normal intravascular enhancement seen throughout the neck.

Limited intracranial: Unremarkable.

Visualized orbits: Visualized globes and orbital soft tissues within
normal limits.

Mastoids and visualized paranasal sinuses: Visualized paranasal
sinuses are clear. Mastoid air cells and middle ear cavities are
well pneumatized and free of fluid.

Skeleton: No acute osseous finding. No discrete lytic or blastic
osseous lesions.

Upper chest: Visualized upper chest demonstrates no acute finding.
Partially visualized lungs are clear.

Other: Extensive soft tissue swelling with inflammatory stranding
seen throughout the right lateral neck, compatible with acute
cellulitis. Overlying skin thickening. Inflammatory changes
partially surround the right sternocleidomastoid muscle. Extension
into the right parotid and submandibular spaces as well as the
submental region, then inferiorly along the anterior aspect of the
anterior neck towards the upper chest. Partial extension into the
right parapharyngeal space as well. There is an ill-defined area of
hypodensity involving the subcutaneous fat at the epicenter of this
inflammatory process within the right neck measuring approximately
2.5 x 1.4 x 3.8 cm (AP by transverse by craniocaudad, concerning for
phlegmon and/or early abscess. This is at the site of maximal soft
tissue/skin swelling.
IMPRESSION: 1. Extensive soft tissue swelling with inflammatory stranding
throughout the right lateral neck, compatible with acute cellulitis.
Superimposed 2.5 x 1.4 x 3.8 cm ill-defined hypodensity within the
subcutaneous fat at the epicenter of this inflammatory process,
concerning for phlegmon and/or early abscess.
2. Mildly enlarged right level II lymph node, likely reactive.
